# Patient Record
Sex: Female | Born: 1955 | Hispanic: No | Marital: Married | Smoking: Never smoker
Health system: Southern US, Community
[De-identification: ages and names within clinical notes are randomized; demographics above are authoritative.]

## PROBLEM LIST (undated history)

## (undated) DIAGNOSIS — I10 Essential (primary) hypertension: Secondary | ICD-10-CM

## (undated) DIAGNOSIS — F329 Major depressive disorder, single episode, unspecified: Secondary | ICD-10-CM

## (undated) DIAGNOSIS — Z87442 Personal history of urinary calculi: Secondary | ICD-10-CM

## (undated) DIAGNOSIS — K8010 Calculus of gallbladder with chronic cholecystitis without obstruction: Secondary | ICD-10-CM

## (undated) DIAGNOSIS — F32A Depression, unspecified: Secondary | ICD-10-CM

## (undated) DIAGNOSIS — M1990 Unspecified osteoarthritis, unspecified site: Secondary | ICD-10-CM

## (undated) DIAGNOSIS — J189 Pneumonia, unspecified organism: Secondary | ICD-10-CM

## (undated) DIAGNOSIS — R7303 Prediabetes: Secondary | ICD-10-CM

## (undated) DIAGNOSIS — I82409 Acute embolism and thrombosis of unspecified deep veins of unspecified lower extremity: Secondary | ICD-10-CM

## (undated) HISTORY — PX: OTHER SURGICAL HISTORY: SHX169

## (undated) HISTORY — PX: APPENDECTOMY: SHX54

## (undated) HISTORY — PX: TOTAL VAGINAL HYSTERECTOMY: SHX2548

## (undated) HISTORY — PX: ABDOMINAL HYSTERECTOMY: SHX81

---

## 2005-09-16 ENCOUNTER — Emergency Department: Payer: Self-pay | Admitting: Internal Medicine

## 2005-11-01 ENCOUNTER — Emergency Department: Payer: Self-pay | Admitting: Emergency Medicine

## 2007-01-02 IMAGING — CT CT HEAD WITHOUT CONTRAST
2 series · 16 of 30 positions shown, 20 images · non-contrast
Comparison: none

REASON FOR EXAM: h/a  tingling in arm   [HOSPITAL]
COMMENTS:

[Series 2: without · axial · non-contrast · 0.39mm/px · z∈[+116,+236]mm · 13 of 29 slices shown, 17 images]
[im 3/29  brain]
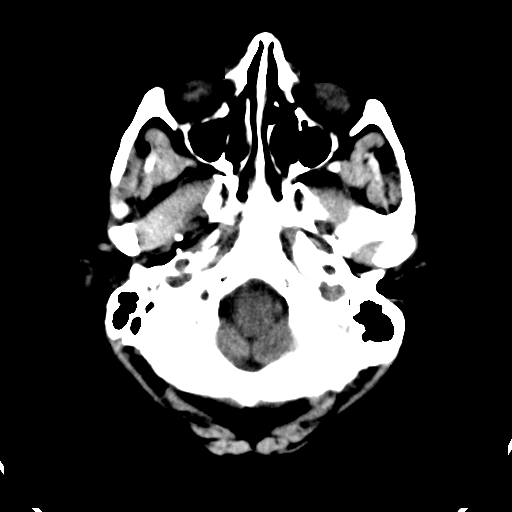
[im 3/29  bone]
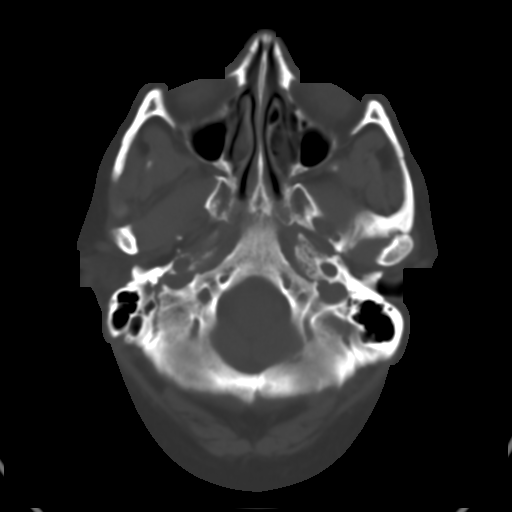
[im 5/29  brain]
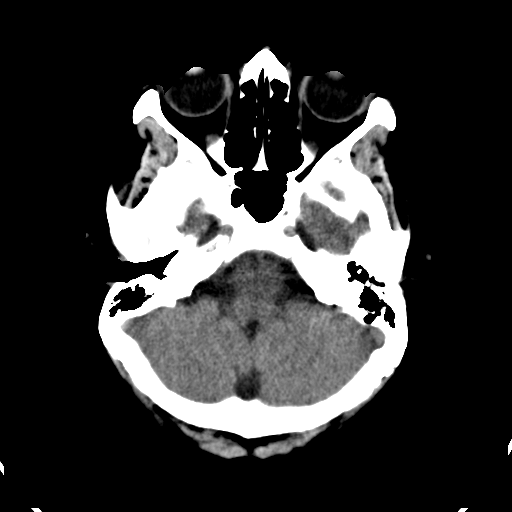
[im 7/29  brain]
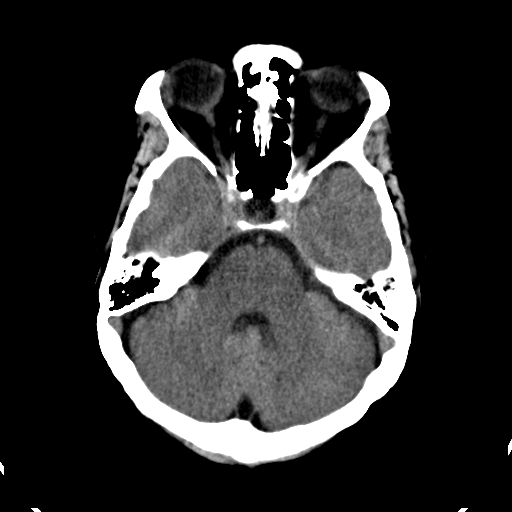
[im 9/29  brain]
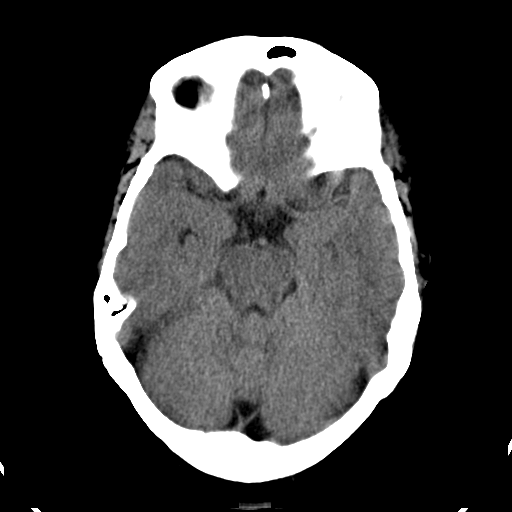
[im 11/29  brain]
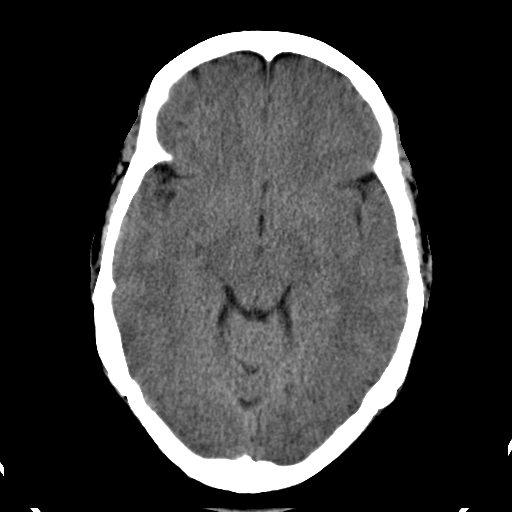
[im 11/29  bone]
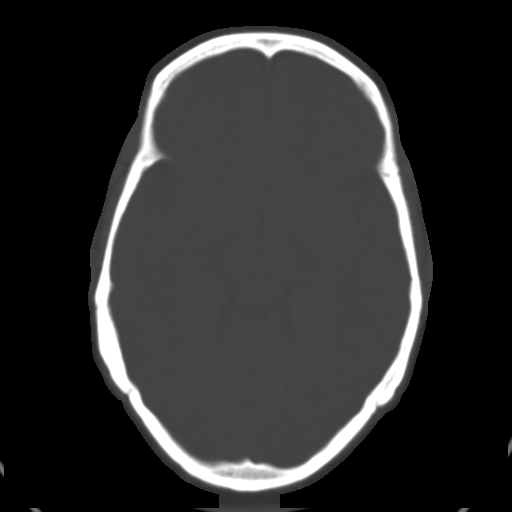
[im 13/29  brain]
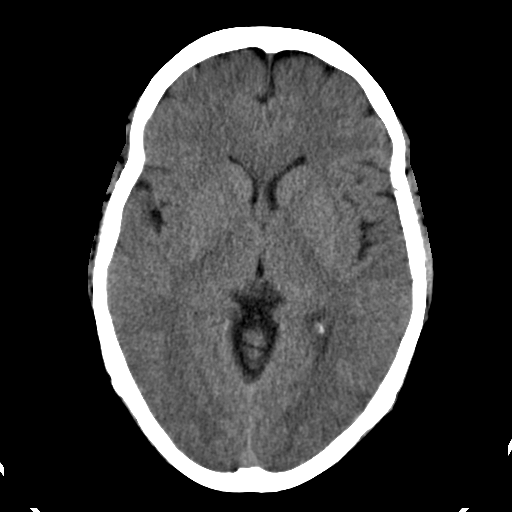
[im 15/29  brain]
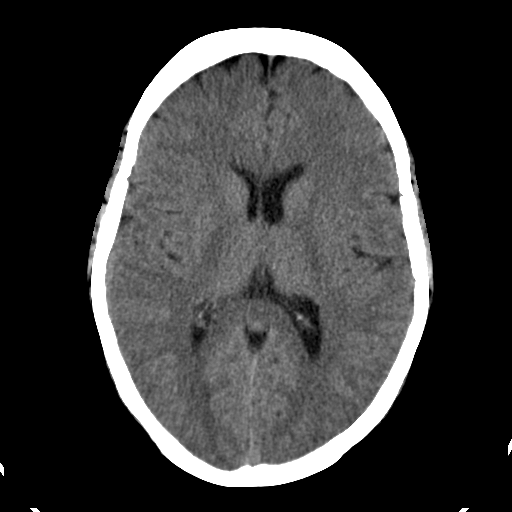
[im 17/29  brain]
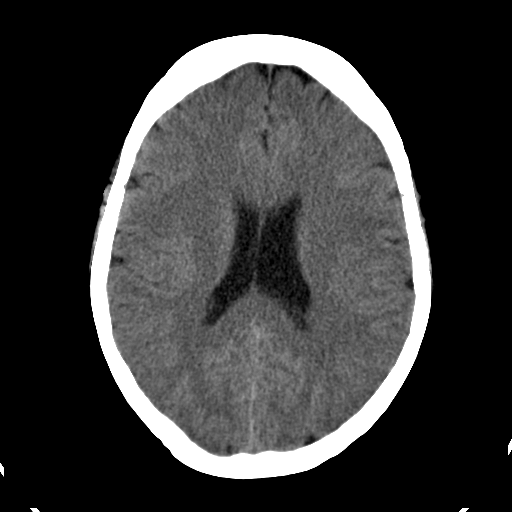
[im 19/29  brain]
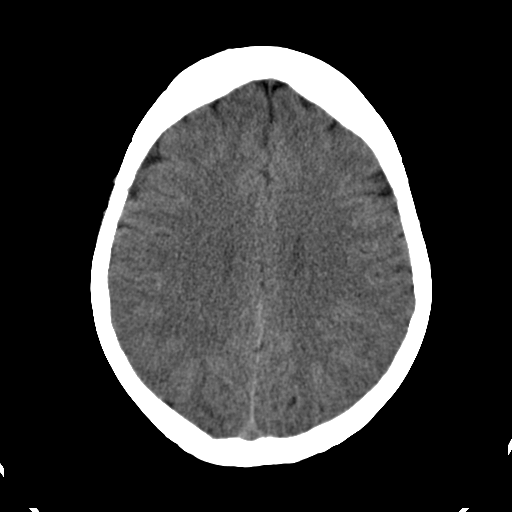
[im 19/29  bone]
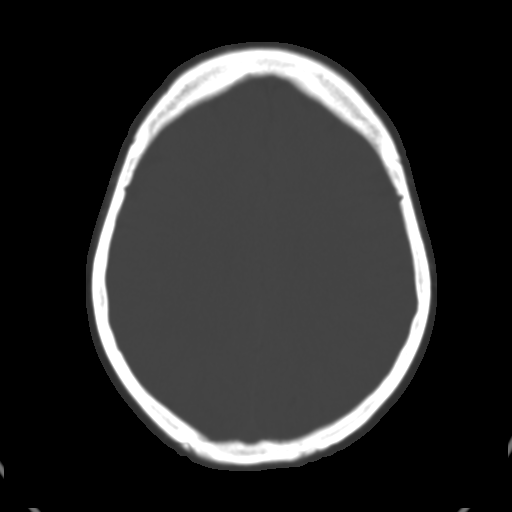
[im 21/29  brain]
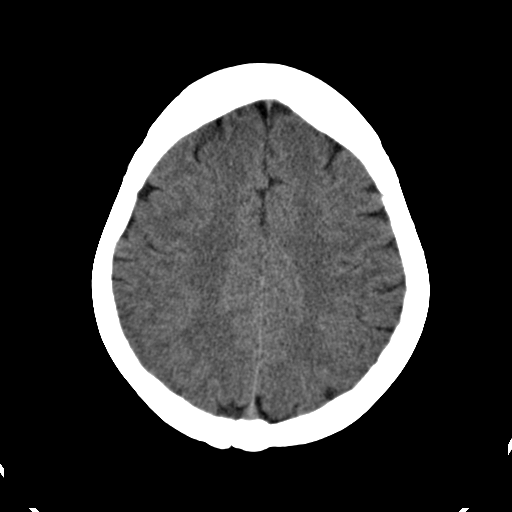
[im 23/29  brain]
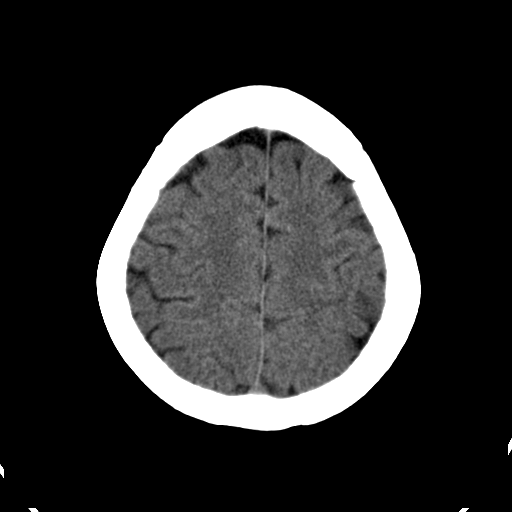
[im 25/29  brain]
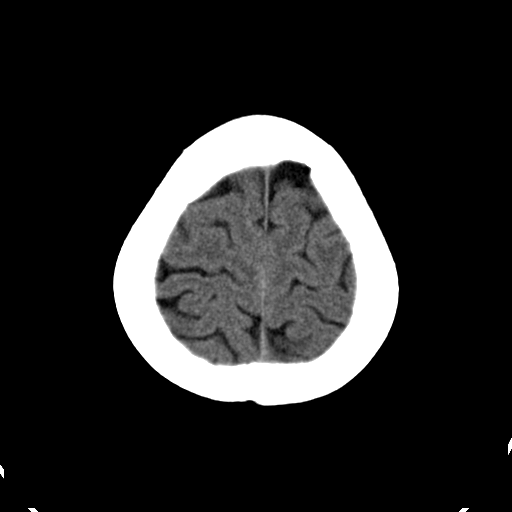
[im 27/29  brain]
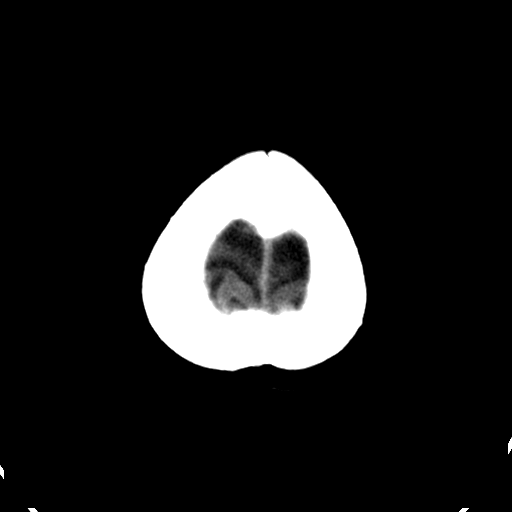
[im 27/29  bone]
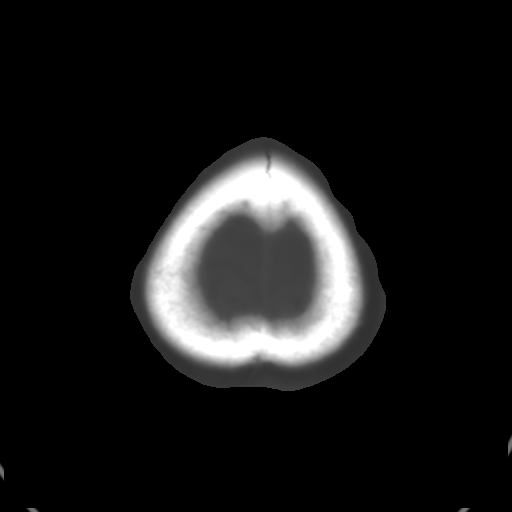

[Series 3: bone · axial · 0.39mm/px · z∈[+116,+156]mm · 3 of 29 slices shown]
[im 3/29  bone]
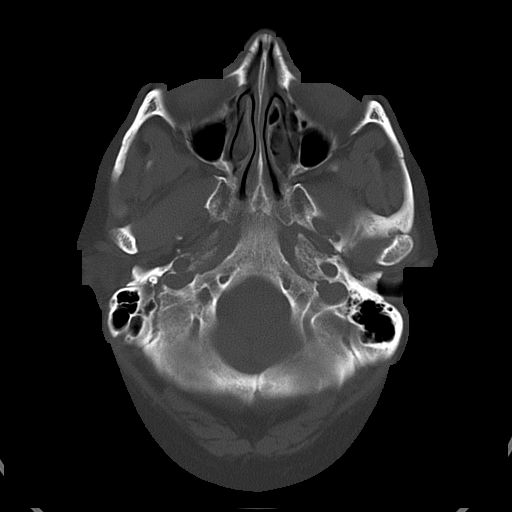
[im 7/29  bone]
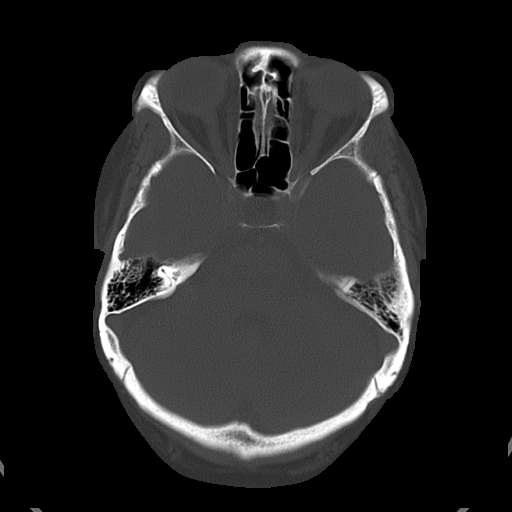
[im 11/29  bone]
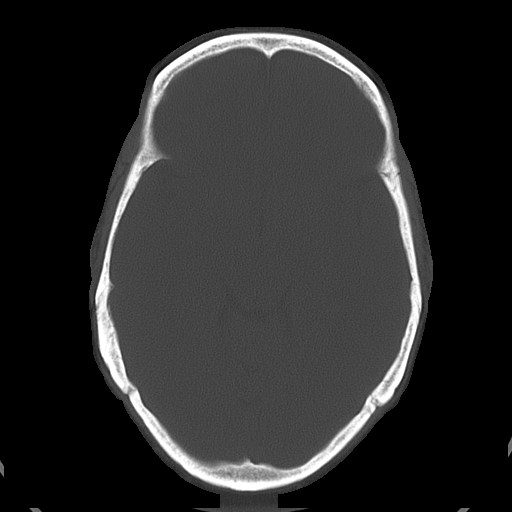

[16 of 30 positions shown; findings below may reference images not displayed]

PROCEDURE:     CT  - CT HEAD WITHOUT CONTRAST  - September 16, 2005  [DATE]

RESULT:     Unenhanced emergent CT scan was obtained for headache and RIGHT
arm weakness.

No intracerebral bleeds or infarcts are noted. No mass effect. No shift to
the midline. The ventricles appear within normal limits. No extra-axial
fluid collections are identified.

On the bone window settings there is incidentally noted a retention cyst in
the LEFT maxillary sinus, otherwise, the sinuses appear clear. The exam was
read by the [HOSPITAL].
IMPRESSION: 1)No significant abnormalities are identified on the unenhanced emergent
Head CT.

## 2009-03-15 ENCOUNTER — Inpatient Hospital Stay: Payer: Self-pay | Admitting: Psychiatry

## 2009-03-16 ENCOUNTER — Ambulatory Visit: Payer: Self-pay | Admitting: Cardiology

## 2016-09-19 ENCOUNTER — Encounter: Payer: Self-pay | Admitting: Medical Oncology

## 2016-09-19 ENCOUNTER — Emergency Department
Admission: EM | Admit: 2016-09-19 | Discharge: 2016-09-19 | Disposition: A | Discharge status: Home / Self Care | Payer: Medicaid Other | Attending: Emergency Medicine | Admitting: Emergency Medicine

## 2016-09-19 DIAGNOSIS — F329 Major depressive disorder, single episode, unspecified: Secondary | ICD-10-CM | POA: Insufficient documentation

## 2016-09-19 DIAGNOSIS — M5442 Lumbago with sciatica, left side: Secondary | ICD-10-CM | POA: Diagnosis not present

## 2016-09-19 DIAGNOSIS — Z791 Long term (current) use of non-steroidal anti-inflammatories (NSAID): Secondary | ICD-10-CM | POA: Diagnosis not present

## 2016-09-19 DIAGNOSIS — I10 Essential (primary) hypertension: Secondary | ICD-10-CM

## 2016-09-19 DIAGNOSIS — M545 Low back pain: Secondary | ICD-10-CM | POA: Diagnosis present

## 2016-09-19 DIAGNOSIS — M5432 Sciatica, left side: Secondary | ICD-10-CM

## 2016-09-19 HISTORY — DX: Depression, unspecified: F32.A

## 2016-09-19 HISTORY — DX: Unspecified osteoarthritis, unspecified site: M19.90

## 2016-09-19 HISTORY — DX: Essential (primary) hypertension: I10

## 2016-09-19 HISTORY — DX: Major depressive disorder, single episode, unspecified: F32.9

## 2016-09-19 MED ORDER — CYCLOBENZAPRINE HCL 5 MG PO TABS: 5.0000 mg | ORAL_TABLET | Freq: Three times a day (TID) | ORAL | 0 refills | Status: DC | PRN

## 2016-09-19 MED ORDER — HYDROCHLOROTHIAZIDE 12.5 MG PO TABS: 12.5000 mg | ORAL_TABLET | Freq: Every day | ORAL | 0 refills | Status: DC

## 2016-09-19 MED ORDER — PREDNISONE 5 MG PO TABS: 30.0000 mg | ORAL_TABLET | Freq: Every day | ORAL | 0 refills | Status: DC

## 2016-09-19 NOTE — ED Notes (Signed)
See triage note  Having pain from lower back which radiates into both legs   But pain is more severe in left leg  Denies any injury

## 2016-09-19 NOTE — ED Provider Notes (Signed)
[facility] Emergency Department Provider Note  ____________________________________________  Time seen: Approximately 10:07 AM  I have reviewed the triage vital signs and the nursing notes.   HISTORY  Chief Complaint Back Pain and Leg Pain  History, physical exam and discharge completed in the presence of the medical interpreter.  HPI Holly Carter is a 60 y.o. female , NAD, presents to the emergency department accompanied by her daughter who assists with history. Patient states she has had left lower back pain that radiates down into the left leg for the past 2 weeks. States it is worsened over the last couple of days. Denies any injury, traumas or falls to exacerbate the pain. States she has chronic arthritis and believes it is related. Notes that she has arthritis in multiple joints of her extremities as well as her back. Denies any saddle paresthesias or loss of bowel or bladder control. Also notes that she has been without her antidepressants as well as antihypertensive medication for well over a month. She unfortunately lost her insurance and has not been able to gain refills on these medications. Her daughter notes that she has an appointment for the patient to establish care with a local primary care physician on November 22. Patient denies any chest pain, shortness of breath, abdominal pain, nausea or vomiting. Has had no fevers, chills, body aches. Denies SI or HI.   Past Medical History:  Diagnosis Date  . Arthritis   . Depression   . Hypertension     There are no active problems to display for this patient.   History reviewed. No pertinent surgical history.  Prior to Admission medications   Medication Sig Start Date End Date Taking? Authorizing Provider  naproxen (NAPROSYN) 500 MG tablet Take 500 mg by mouth 2 (two) times daily with a meal.   Yes Historical Provider, MD  cyclobenzaprine (FLEXERIL) 5 MG tablet Take 1 tablet (5 mg total) by  mouth 3 (three) times daily as needed for muscle spasms. 09/19/16   Neri Samek L Rome Echavarria, PA-C  hydrochlorothiazide (HYDRODIURIL) 12.5 MG tablet Take 1 tablet (12.5 mg total) by mouth daily. 09/19/16 10/19/16  [place] L Johanne Mcglade, PA-C  predniSONE (DELTASONE) 5 MG tablet Take 6 tablets (30 mg total) by mouth daily with breakfast. May take for up to 5 days.  Do not take any NSAIDs with this medication. Take with food. 09/19/16   Tinzlee Craker L Aerie Donica, PA-C    Allergies Patient has no known allergies.  No family history on file.  Social History Social History  Substance Use Topics  . Smoking status: Never Smoker  . Smokeless tobacco: Never Used  . Alcohol use No     Review of Systems  Constitutional: No fever/chills Eyes: No visual changes.  Cardiovascular: No chest pain. Respiratory: No shortness of breath. No wheezing.  Gastrointestinal: No abdominal pain.  No nausea, vomiting.  Musculoskeletal: Positive for back pain radiating into left leg.  Skin: Negative for rash. Neurological: Negative for headaches, focal weakness or numbness.   No tingling. No saddle paresthesias or loss of bowel or bladder control Psychological:  Positive chronic depression. Denies SI or HI. 10-point ROS otherwise negative.  ____________________________________________   PHYSICAL EXAM:  VITAL SIGNS: ED Triage Vitals  Enc Vitals Group     BP 09/19/16 0936 (!) 152/84     Pulse Rate 09/19/16 0936 73     Resp 09/19/16 0936 18     Temp 09/19/16 0936 98.1 F (36.7 C)     Temp  Source 09/19/16 0936 Oral     SpO2 09/19/16 0936 98 %     Weight 09/19/16 0937 200 lb (90.7 kg)     Height 09/19/16 0937 5\' 4"  (1.626 m)     Head Circumference --      Peak Flow --      Pain Score 09/19/16 0938 10     Pain Loc --      Pain Edu? --      Excl. in GC? --      Constitutional: Alert and oriented. Well appearing and in no acute distress. Eyes: Conjunctivae are normal Without icterus or injection Head:  Atraumatic. Cardiovascular: Normal rate, regular rhythm. Normal S1 and S2.  Good peripheral circulation. Respiratory: Normal respiratory effort without tachypnea or retractions. Lungs CTAB with breath sounds noted in all lung fields. No wheeze, rhonchi, rales. Musculoskeletal: No tenderness to palpation about the lumbar spine or paraspinal regions. Mild left SI joint tenderness with deep palpation. Negative straight leg raise bilaterally. Full range of motion of bilateral upper and lower extremities without difficulty. No lower extremity tenderness nor edema.  No joint effusions. Neurologic:  Normal speech and language. No gross focal neurologic deficits are appreciated.  Skin:  Skin is warm, dry and intact. No rash noted. Patient does have black ecchymosis about bilateral upper buttocks in which she states have been present for 5 years from previous injections that she received in [place] containing vitamins. No tenderness to palpation. No cellulitis or induration. Psychiatric: Mood and affect are normal. Speech and behavior are normal. Patient exhibits appropriate insight and judgement.   ____________________________________________   LABS  None ____________________________________________  EKG  None ____________________________________________  RADIOLOGY  None ____________________________________________    PROCEDURES  Procedure(s) performed: None   Procedures   Medications - No data to display   ____________________________________________   INITIAL IMPRESSION / ASSESSMENT AND PLAN / ED COURSE  Pertinent labs & imaging results that were available during my care of the patient were reviewed by me and considered in my medical decision making (see chart for details).  Clinical Course     Patient's diagnosis is consistent with Sciatica of the left side and unspecified hypertension. Patient will be discharged home with prescriptions for prednisone, Flexeril and  hydrochlorothiazide to take as directed. Patient is to follow up with her primary care provider as scheduled on November 22 to establish care and for recheck. Discussed with the patient that unfortunately we would not be able to refill her antidepressants that she had been off the medications were too long and discuss potential side effects that would need to be managed and followed by her new primary care provider. Patient and her daughter at the bedside and understands such. Patient is given ED precautions to return to the ED for any worsening or new symptoms.    ____________________________________________  FINAL CLINICAL IMPRESSION(S) / ED DIAGNOSES  Final diagnoses:  Sciatica of left side  Hypertension, unspecified type      NEW MEDICATIONS STARTED DURING THIS VISIT:  Discharge Medication List as of 09/19/2016 10:10 AM    START taking these medications   Details  cyclobenzaprine (FLEXERIL) 5 MG tablet Take 1 tablet (5 mg total) by mouth 3 (three) times daily as needed for muscle spasms., Starting Fri 09/19/2016, Print    hydrochlorothiazide (HYDRODIURIL) 12.5 MG tablet Take 1 tablet (12.5 mg total) by mouth daily., Starting Fri 09/19/2016, Until Sun 10/19/2016, Print    predniSONE (DELTASONE) 5 MG tablet Take 6 tablets (30 mg total)  by mouth daily with breakfast. May take for up to 5 days.  Do not take any NSAIDs with this medication. Take with food., Starting Fri 09/19/2016, Print             Ernestene Kiel Lawler, PA-C 09/19/16 1307    Emily Filbert, MD 09/19/16 1348

## 2016-09-19 NOTE — ED Triage Notes (Signed)
Using interpreter pt reports that she has been having lower back pain with radiation of pain into left leg for several weeks, pain worsened yesterday.

## 2017-01-06 ENCOUNTER — Encounter: Payer: Self-pay | Admitting: *Deleted

## 2017-01-06 ENCOUNTER — Emergency Department
Admission: EM | Admit: 2017-01-06 | Discharge: 2017-01-06 | Disposition: A | Discharge status: Home / Self Care | Payer: Medicaid Other | Attending: Emergency Medicine | Admitting: Emergency Medicine

## 2017-01-06 DIAGNOSIS — Z79899 Other long term (current) drug therapy: Secondary | ICD-10-CM | POA: Insufficient documentation

## 2017-01-06 DIAGNOSIS — H7291 Unspecified perforation of tympanic membrane, right ear: Secondary | ICD-10-CM | POA: Diagnosis not present

## 2017-01-06 DIAGNOSIS — I10 Essential (primary) hypertension: Secondary | ICD-10-CM | POA: Insufficient documentation

## 2017-01-06 DIAGNOSIS — H9201 Otalgia, right ear: Secondary | ICD-10-CM | POA: Diagnosis present

## 2017-01-06 MED ORDER — OFLOXACIN 0.3 % OT SOLN: 5.0000 [drp] | Freq: Every day | OTIC | 0 refills | Status: AC

## 2017-01-06 NOTE — ED Notes (Signed)
See triage note  Per interpreter she has had bilateral ear pain/pressure for couple of days  Afebrile on arrival

## 2017-01-06 NOTE — ED Triage Notes (Signed)
Pt complains of bilateral ear pain starting Sunday, pt reports increased pain today with pressure

## 2017-01-06 NOTE — ED Provider Notes (Signed)
[facility] Provider Note  ____________________________________________  Time seen: Approximately 6:33 PM  I have reviewed the triage vital signs and the nursing notes.   HISTORY  Chief Complaint Otalgia    HPI Holly Carter is a 61 y.o. female who presents emergency department complaining of sharp right ear pain and mild left ear pain. Per the patient, symptoms have been ongoing 2 days and worsening. Patient reports that she does use Q-tips to clean out her ears on a regular basis. She states over the last 2 days she has noted some bloody drainage from her right ear. Patient denies any fevers or chills, nasal congestion, sore throat, coughing. No other URI symptoms. No other complaints at this time. No medications prior to arrival for this complaint.   Past Medical History:  Diagnosis Date  . Arthritis   . Depression   . Hypertension     There are no active problems to display for this patient.   History reviewed. No pertinent surgical history.  Prior to Admission medications   Medication Sig Start Date End Date Taking? Authorizing Provider  cyclobenzaprine (FLEXERIL) 5 MG tablet Take 1 tablet (5 mg total) by mouth 3 (three) times daily as needed for muscle spasms. 09/19/16   Jami L Hagler, PA-C  hydrochlorothiazide (HYDRODIURIL) 12.5 MG tablet Take 1 tablet (12.5 mg total) by mouth daily. 09/19/16 10/19/16  Jami L Hagler, PA-C  naproxen (NAPROSYN) 500 MG tablet Take 500 mg by mouth 2 (two) times daily with a meal.    Historical Provider, MD  ofloxacin (FLOXIN) 0.3 % otic solution Place 5 drops into the right ear daily. 01/06/17 01/13/17  Christiane HaJonathan D Medford Staheli, PA-C  predniSONE (DELTASONE) 5 MG tablet Take 6 tablets (30 mg total) by mouth daily with breakfast. May take for up to 5 days.  Do not take any NSAIDs with this medication. Take with food. 09/19/16   Jami L Hagler, PA-C    Allergies Patient has no known  allergies.  No family history on file.  Social History Social History  Substance Use Topics  . Smoking status: Never Smoker  . Smokeless tobacco: Never Used  . Alcohol use No     Review of Systems  Constitutional: No fever/chills Eyes: No visual changes. No discharge ENT: Positive for bilateral ear pain, worse on right. Bloody drainage noted from right ear. Cardiovascular: no chest pain. Respiratory: no cough. No SOB. Gastrointestinal: No abdominal pain.  No nausea, no vomiting.  Musculoskeletal: Negative for musculoskeletal pain. Skin: Negative for rash, abrasions, lacerations, ecchymosis. Neurological: Negative for headaches, focal weakness or numbness. 10-point ROS otherwise negative.  ____________________________________________   PHYSICAL EXAM:  VITAL SIGNS: ED Triage Vitals  Enc Vitals Group     BP 01/06/17 1752 136/65     Pulse Rate 01/06/17 1752 78     Resp 01/06/17 1752 20     Temp 01/06/17 1752 98.6 F (37 C)     Temp Source 01/06/17 1752 Oral     SpO2 01/06/17 1752 96 %     Weight 01/06/17 1753 180 lb (81.6 kg)     Height 01/06/17 1753 4\' 9"  (1.448 m)     Head Circumference --      Peak Flow --      Pain Score 01/06/17 1753 9     Pain Loc --      Pain Edu? --      Excl. in GC? --      Constitutional: Alert and oriented.  Well appearing and in no acute distress. Eyes: Conjunctivae are normal. PERRL. EOMI. Head: Atraumatic. ENT:      Ears: TM and EAC on left unremarkable. EAC on right has bloody drainage noted. Coagulation and blood noted against TM. There does appear to be a perforation to the tympanic membrane. Membrane is not completely visualized due to congealed blood in the ear canal.      Nose: No congestion/rhinnorhea.      Mouth/Throat: Mucous membranes are moist.  Neck: No stridor.   Hematological/Lymphatic/Immunilogical: No cervical lymphadenopathy. Cardiovascular: Normal rate, regular rhythm. Normal S1 and S2.  Good peripheral  circulation. Respiratory: Normal respiratory effort without tachypnea or retractions. Lungs CTAB. Good air entry to the bases with no decreased or absent breath sounds. Musculoskeletal: Full range of motion to all extremities. No gross deformities appreciated. Neurologic:  Normal speech and language. No gross focal neurologic deficits are appreciated.  Skin:  Skin is warm, dry and intact. No rash noted. Psychiatric: Mood and affect are normal. Speech and behavior are normal. Patient exhibits appropriate insight and judgement.   ____________________________________________   LABS (all labs ordered are listed, but only abnormal results are displayed)  Labs Reviewed - No data to display ____________________________________________  EKG   ____________________________________________  RADIOLOGY   No results found.  ____________________________________________    PROCEDURES  Procedure(s) performed:    Procedures    Medications - No data to display   ____________________________________________   INITIAL IMPRESSION / ASSESSMENT AND PLAN / ED COURSE  Pertinent labs & imaging results that were available during my care of the patient were reviewed by me and considered in my medical decision making (see chart for details).  Review of the [place] CSRS was performed in accordance of the [facility] prior to dispensing any controlled drugs.     Patient's diagnosis is consistent with tympanic membrane perforation on right. This is likely secondary to use of Q-tips in the ear. There is blood in the EAC and against TM. This caused him to be not completely or well visualized. However there doesn't appear to be a perforation.. Patient will be discharged home with prescriptions for ofloxacin eardrops as this was likely an external puncture.. Patient is to follow up with ENT provider. Patient is given ED precautions to return to the ED for any worsening or new  symptoms.     ____________________________________________  FINAL CLINICAL IMPRESSION(S) / ED DIAGNOSES  Final diagnoses:  Tympanic membrane perforation, right      NEW MEDICATIONS STARTED DURING THIS VISIT:  New Prescriptions   OFLOXACIN (FLOXIN) 0.3 % OTIC SOLUTION    Place 5 drops into the right ear daily.        This chart was dictated using voice recognition software/Dragon. Despite best efforts to proofread, errors can occur which can change the meaning. Any change was purely unintentional.    Racheal Patches, PA-C 01/06/17 1840    Sharman Cheek, MD 01/09/17 [phone]

## 2017-01-06 NOTE — ED Triage Notes (Signed)
interpreter used for triage 

## 2017-02-26 ENCOUNTER — Other Ambulatory Visit: Payer: Self-pay | Admitting: Otolaryngology

## 2017-02-26 DIAGNOSIS — K1120 Sialoadenitis, unspecified: Secondary | ICD-10-CM

## 2017-03-09 ENCOUNTER — Ambulatory Visit
Admission: RE | Admit: 2017-03-09 | Discharge: 2017-03-09 | Disposition: A | Discharge status: Home / Self Care | Payer: Medicaid Other | Source: Ambulatory Visit | Attending: Otolaryngology | Admitting: Otolaryngology

## 2017-03-09 DIAGNOSIS — K1120 Sialoadenitis, unspecified: Secondary | ICD-10-CM | POA: Diagnosis not present

## 2017-03-09 LAB — POCT I-STAT CREATININE: Creatinine, Ser: 0.5 mg/dL (ref 0.44–1.00)

## 2017-03-09 MED ORDER — IOPAMIDOL (ISOVUE-300) INJECTION 61%
75.0000 mL | Freq: Once | INTRAVENOUS | Status: AC | PRN
  Administered 2017-03-09: 75 mL via INTRAVENOUS

## 2017-07-17 ENCOUNTER — Encounter: Payer: Self-pay | Admitting: Emergency Medicine

## 2017-07-17 ENCOUNTER — Emergency Department: Payer: Medicaid Other

## 2017-07-17 DIAGNOSIS — Z79899 Other long term (current) drug therapy: Secondary | ICD-10-CM | POA: Diagnosis not present

## 2017-07-17 DIAGNOSIS — R51 Headache: Secondary | ICD-10-CM | POA: Insufficient documentation

## 2017-07-17 DIAGNOSIS — R55 Syncope and collapse: Secondary | ICD-10-CM | POA: Diagnosis present

## 2017-07-17 DIAGNOSIS — I10 Essential (primary) hypertension: Secondary | ICD-10-CM | POA: Diagnosis not present

## 2017-07-17 DIAGNOSIS — N3000 Acute cystitis without hematuria: Secondary | ICD-10-CM | POA: Insufficient documentation

## 2017-07-17 LAB — BASIC METABOLIC PANEL
ANION GAP: 8 (ref 5–15)
BUN: 15 mg/dL (ref 6–20)
CO2: 24 mmol/L (ref 22–32)
Calcium: 9.1 mg/dL (ref 8.9–10.3)
Chloride: 108 mmol/L (ref 101–111)
Creatinine, Ser: 0.8 mg/dL (ref 0.44–1.00)
GFR calc Af Amer: >60 mL/min (ref >60)
Glucose, Bld: 150 mg/dL — ABNORMAL HIGH (ref 65–99)
POTASSIUM: 3.9 mmol/L (ref 3.5–5.1)
SODIUM: 140 mmol/L (ref 135–145)

## 2017-07-17 LAB — CBC
HCT: 39.4 % (ref 35.0–47.0)
HEMOGLOBIN: 13.6 g/dL (ref 12.0–16.0)
MCH: 32.9 pg (ref 26.0–34.0)
MCHC: 34.6 g/dL (ref 32.0–36.0)
MCV: 95.3 fL (ref 80.0–100.0)
Platelets: 129 10*3/uL — ABNORMAL LOW (ref 150–440)
RBC: 4.14 MIL/uL (ref 3.80–5.20)
RDW: 15.2 % — ABNORMAL HIGH (ref 11.5–14.5)
WBC: 3 10*3/uL — AB (ref 3.6–11.0)

## 2017-07-17 LAB — TROPONIN I: Troponin I: <0.03 ng/mL (ref <0.03)

## 2017-07-17 NOTE — ED Triage Notes (Signed)
Pt presents via POV c/o syncopal episodes x2 at home. With with multiple complaints including headache, throat pain, and left sided chest pain x3 days.

## 2017-07-18 ENCOUNTER — Emergency Department
Admission: EM | Admit: 2017-07-18 | Discharge: 2017-07-18 | Disposition: A | Discharge status: Home / Self Care | Payer: Medicaid Other | Attending: Emergency Medicine | Admitting: Emergency Medicine

## 2017-07-18 ENCOUNTER — Emergency Department: Payer: Medicaid Other

## 2017-07-18 DIAGNOSIS — R51 Headache: Secondary | ICD-10-CM

## 2017-07-18 DIAGNOSIS — N390 Urinary tract infection, site not specified: Secondary | ICD-10-CM

## 2017-07-18 DIAGNOSIS — R519 Headache, unspecified: Secondary | ICD-10-CM

## 2017-07-18 DIAGNOSIS — R55 Syncope and collapse: Secondary | ICD-10-CM

## 2017-07-18 LAB — LIPASE, BLOOD: Lipase: 31 U/L (ref 11–51)

## 2017-07-18 LAB — URINALYSIS, COMPLETE (UACMP) WITH MICROSCOPIC
BILIRUBIN URINE: NEGATIVE
GLUCOSE, UA: NEGATIVE mg/dL
Hgb urine dipstick: NEGATIVE
KETONES UR: NEGATIVE mg/dL
Nitrite: POSITIVE — AB
PH: 5 (ref 5.0–8.0)
Protein, ur: NEGATIVE mg/dL
Specific Gravity, Urine: 1.021 (ref 1.005–1.030)

## 2017-07-18 LAB — HEPATIC FUNCTION PANEL
ALBUMIN: 3.9 g/dL (ref 3.5–5.0)
ALK PHOS: 109 U/L (ref 38–126)
ALT: 53 U/L (ref 14–54)
AST: 55 U/L — ABNORMAL HIGH (ref 15–41)
Bilirubin, Direct: <0.1 mg/dL — ABNORMAL LOW (ref 0.1–0.5)
TOTAL PROTEIN: 6.6 g/dL (ref 6.5–8.1)
Total Bilirubin: 0.5 mg/dL (ref 0.3–1.2)

## 2017-07-18 MED ORDER — KETOROLAC TROMETHAMINE 30 MG/ML IJ SOLN
30.0000 mg | Freq: Once | INTRAMUSCULAR | Status: AC
  Administered 2017-07-18: 30 mg via INTRAVENOUS
  Filled 2017-07-18: qty 1

## 2017-07-18 MED ORDER — METOCLOPRAMIDE HCL 5 MG/ML IJ SOLN
10.0000 mg | Freq: Once | INTRAMUSCULAR | Status: AC
  Administered 2017-07-18: 10 mg via INTRAVENOUS
  Filled 2017-07-18: qty 2

## 2017-07-18 MED ORDER — CEPHALEXIN 500 MG PO CAPS
500.0000 mg | ORAL_CAPSULE | Freq: Two times a day (BID) | ORAL | 0 refills | Status: AC
Start: 2017-07-18 — End: 2017-07-28

## 2017-07-18 MED ORDER — IOPAMIDOL (ISOVUE-300) INJECTION 61%
30.0000 mL | Freq: Once | INTRAVENOUS | Status: AC | PRN
  Administered 2017-07-18: 30 mL via ORAL

## 2017-07-18 MED ORDER — IOPAMIDOL (ISOVUE-300) INJECTION 61%
100.0000 mL | Freq: Once | INTRAVENOUS | Status: AC | PRN
  Administered 2017-07-18: 100 mL via INTRAVENOUS

## 2017-07-18 MED ORDER — SODIUM CHLORIDE 0.9 % IV BOLUS (SEPSIS)
1000.0000 mL | Freq: Once | INTRAVENOUS | Status: AC
  Administered 2017-07-18: 1000 mL via INTRAVENOUS

## 2017-07-18 NOTE — ED Notes (Signed)
Pt assisted up to urinate ° °

## 2017-07-18 NOTE — ED Notes (Signed)
Dr. Marisa Severin in to reassess.

## 2017-07-18 NOTE — ED Notes (Signed)
Information for assessment obtained with Rozetta Nunnery, spanish intrepreter with [employer]. Pt states she has had a sore throat for 3 months, right sided headache for three months. Pt states tonight had an episode of becoming dizzy and fainted. Family states she was not "coming around like she should" prompting them to come to ED. Pt denies shob. Pt states she has had some left sided chet pain for over 3 months as well.

## 2017-07-18 NOTE — ED Notes (Signed)
Pt and family updated on progression of ct results.

## 2017-07-18 NOTE — ED Notes (Signed)
Report to kristin, rn.  

## 2017-07-18 NOTE — ED Provider Notes (Signed)
[facility] Emergency Department Provider Note ____________________________________________   First MD Initiated Contact with Patient 07/18/17 0150     (approximate)  I have reviewed the triage vital signs and the nursing notes.   HISTORY  Chief Complaint Loss of Consciousness and Headache    HPI Holly Carter is a 61 y.o. female With history of hypertension, depression, and arthritis as well as migraines presents with syncope twice today approximately 4 hours ago, occurring while the patient was sitting on the couch and associated with a prodrome of feeling lightheaded and weak. Patient states that she has had a headache for the last 3 days which is gradual in onset and identical to prior migraines which she has had multiple times in the past. Patient also reports some left-sided chest pain primarily around the left breast, and chronic throat discomfort x months.  She also reports some right sided lower abd pain.  No head trauma.    Past Medical History:  Diagnosis Date  . Arthritis   . Depression   . Hypertension     There are no active problems to display for this patient.   No past surgical history on file.  Prior to Admission medications   Medication Sig Start Date End Date Taking? Authorizing Provider  cephALEXin (KEFLEX) 500 MG capsule Take 1 capsule (500 mg total) by mouth 2 (two) times daily. 07/18/17 07/28/17  Dionne Bucy, MD  cyclobenzaprine (FLEXERIL) 5 MG tablet Take 1 tablet (5 mg total) by mouth 3 (three) times daily as needed for muscle spasms. 09/19/16   Hagler, Jami L, PA-C  hydrochlorothiazide (HYDRODIURIL) 12.5 MG tablet Take 1 tablet (12.5 mg total) by mouth daily. 09/19/16 10/19/16  Hagler, Jami L, PA-C  naproxen (NAPROSYN) 500 MG tablet Take 500 mg by mouth 2 (two) times daily with a meal.    [provider]  predniSONE (DELTASONE) 5 MG tablet Take 6 tablets (30 mg total) by mouth daily with breakfast. May  take for up to 5 days.  Do not take any NSAIDs with this medication. Take with food. 09/19/16   Hagler, Jami L, PA-C    Allergies Patient has no known allergies.  No family history on file.  Social History Social History  Substance Use Topics  . Smoking status: Never Smoker  . Smokeless tobacco: Never Used  . Alcohol use No    Review of Systems  Constitutional: No fever/chills Eyes: No visual changes. ENT: Positive for sore throat. Cardiovascular: Positive for chest pain. Respiratory: Denies shortness of breath. Gastrointestinal: Positive for nausea, no vomiting.  No diarrhea.  Genitourinary: Negative for dysuria or frequency.  Musculoskeletal: Negative for back pain. Skin: Negative for rash. Neurological: Positive for headache.   ____________________________________________   PHYSICAL EXAM:  VITAL SIGNS: ED Triage Vitals  Enc Vitals Group     BP 07/17/17 2208 (!) 150/71     Pulse Rate 07/17/17 2208 84     Resp 07/17/17 2208 14     Temp 07/17/17 2208 99.1 F (37.3 C)     Temp Source 07/17/17 2208 Oral     SpO2 07/17/17 2208 93 %     Weight 07/17/17 2208 195 lb (88.5 kg)     Height 07/17/17 2208  (1.549 m)     Head Circumference --      Peak Flow --      Pain Score 07/17/17 2207 9     Pain Loc --      Pain Edu? --  Excl. in GC? --     Constitutional: Alert and oriented. Slightly uncomfortable appearing, in no acute distress. Eyes: Conjunctivae are normal.  EOMI.  PERRLA.  Head: Atraumatic. Nose: No congestion/rhinnorhea. Mouth/Throat: Mucous membranes are moist.  Oropharynx clear with no erythema or swelling.  Neck: Normal range of motion. Moving head freely. No meningeal signs.  Cardiovascular: Normal rate, regular rhythm. Grossly normal heart sounds.  Good peripheral circulation. Respiratory: Normal respiratory effort.  No retractions. Lungs CTAB. Gastrointestinal: Soft with mild RLQ tenderness. No distention.  Genitourinary: No CVA  tenderness. Musculoskeletal: No lower extremity edema.  Extremities warm and well perfused.  Neurologic:  Normal speech and language. No gross focal neurologic deficits are appreciated. CNs III-XII intact.  Normal coordination.  Motor and sensory intact in all extremities.   Skin:  Skin is warm and dry. No rash noted. Psychiatric: Mood and affect are normal. Speech and behavior are normal.  ____________________________________________   LABS (all labs ordered are listed, but only abnormal results are displayed)  Labs Reviewed  BASIC METABOLIC PANEL - Abnormal; Notable for the following:       Result Value   Glucose, Bld 150 (*)    All other components within normal limits  CBC - Abnormal; Notable for the following:    WBC 3.0 (*)    RDW 15.2 (*)    Platelets 129 (*)    All other components within normal limits  URINALYSIS, COMPLETE (UACMP) WITH MICROSCOPIC - Abnormal; Notable for the following:    Color, Urine YELLOW (*)    APPearance CLEAR (*)    Nitrite POSITIVE (*)    Leukocytes, UA TRACE (*)    Bacteria, UA FEW (*)    Squamous Epithelial / LPF 0-5 (*)    All other components within normal limits  HEPATIC FUNCTION PANEL - Abnormal; Notable for the following:    AST 55 (*)    Bilirubin, Direct <0.1 (*)    All other components within normal limits  TROPONIN I  LIPASE, BLOOD   ____________________________________________  EKG  ED ECG REPORT I, Dionne Bucy, the attending physician, personally viewed and interpreted this ECG.  Date: 07/18/2017 EKG Time: 2209 Rate: 78 Rhythm: normal sinus rhythm QRS Axis: normal Intervals: left posterior fascicular block ST/T Wave abnormalities: flat T-wave in V2 Narrative Interpretation: no evidence of acute ischemia; no significant changes when compared to EKG of 03/16/2009  ____________________________________________  RADIOLOGY  Chest x-ray shows aortic atherosclerosis, no acute pulmonary findings.    ____________________________________________   PROCEDURES  Procedure(s) performed: No    Critical Care performed: No ____________________________________________   INITIAL IMPRESSION / ASSESSMENT AND PLAN / ED COURSE  Pertinent labs & imaging results that were available during my care of the patient were reviewed by me and considered in my medical decision making (see chart for details).  61 year old female with past medical history as noted presents with syncope twice this evening with prodrome of lightheadedness, as well as multiple symptoms over the last few days including headache which is similar to prior migraines, left chest pain, right lower abdominal pain, and throat pain. Vital signs are normal, patient is comfortable appearing, and exam is remarkable for mild right lower quadrant tenderness but with no other significant findings.    A/P: 1. Syncope: suspect most likely vasovagal episode given the prodrome of lightheadedness and the occurence after patient had been having multiple other symptoms over the last few days - possible related dec PO intake and dehydration. EKG with no concerning findings.  Pending troponin and will give fluids and reassess.   2. Headache: nonfocal neuro exam, no fever or meningeal signs, and prior hx of similar headaches.  Susp most likely migraine.  No indication for imaging; plan for symptomatic tx with reglan, toradol and reassess.  3. Chest pain: low susp for ACS, given unremarkable EKG.  Pending troponin.  If negative, no indication for further workup.  Given multiple other concurrent sx, no evidence to suggest PE, or aortic etiology.  4. Abd pain: mild RLQ discomfort on exam.  Pt is s/p appendectomy.  Will obtain UA to r/o UTI/cysitis; if negative and pt has persistent pain and tenderness, will likely obtain CT abd.  Also consider hepatobiliary cause though location of pain makes this less  likely.  ----------------------------------------- 5:41 AM on 07/18/2017 -----------------------------------------  Patient's workup is significant for positive UA with nitrates and WBCs, consistent with UTI.  Other lab workup is unremarkable. On reassessment, patient reports that headache is almost completely resolved and she states she feels significantly better. She states that her abdominal pain is also improved but on exam she is still significantly tender in the right lower quadrant. This may be from UTI/cystitis however given the focal tenderness I will obtain CT to rule out intra-abdominal process. Anticipate that if CT is negative and patient continues to feel better, she will be able to be discharged home with abx for UTI.     ----------------------------------------- 7:19 AM on 07/18/2017 -----------------------------------------  CT with no acute findings. Patient states she feels better and appears comfortable. We will proceed with discharge, with antibiotics for UTI.  Return precautions given.  ____________________________________________   FINAL CLINICAL IMPRESSION(S) / ED DIAGNOSES  Final diagnoses:  Urinary tract infection without hematuria, site unspecified  Syncope, unspecified syncope type  Acute nonintractable headache, unspecified headache type      NEW MEDICATIONS STARTED DURING THIS VISIT:  New Prescriptions   CEPHALEXIN (KEFLEX) 500 MG CAPSULE    Take 1 capsule (500 mg total) by mouth 2 (two) times daily.     Note:  This document was prepared using Dragon voice recognition software and may include unintentional dictation errors.    Dionne Bucy, MD 07/18/17 [phone]

## 2017-07-18 NOTE — ED Notes (Signed)
Pt sleeping. 

## 2017-12-08 ENCOUNTER — Emergency Department
Admission: EM | Admit: 2017-12-08 | Discharge: 2017-12-08 | Disposition: A | Discharge status: Home / Self Care | Payer: Medicaid Other | Attending: Emergency Medicine | Admitting: Emergency Medicine

## 2017-12-08 ENCOUNTER — Other Ambulatory Visit: Payer: Self-pay

## 2017-12-08 DIAGNOSIS — F339 Major depressive disorder, recurrent, unspecified: Secondary | ICD-10-CM

## 2017-12-08 DIAGNOSIS — F338 Other recurrent depressive disorders: Secondary | ICD-10-CM | POA: Diagnosis not present

## 2017-12-08 DIAGNOSIS — F329 Major depressive disorder, single episode, unspecified: Secondary | ICD-10-CM | POA: Diagnosis present

## 2017-12-08 DIAGNOSIS — Z79899 Other long term (current) drug therapy: Secondary | ICD-10-CM | POA: Diagnosis not present

## 2017-12-08 DIAGNOSIS — I10 Essential (primary) hypertension: Secondary | ICD-10-CM | POA: Diagnosis not present

## 2017-12-08 DIAGNOSIS — R55 Syncope and collapse: Secondary | ICD-10-CM

## 2017-12-08 LAB — URINALYSIS, COMPLETE (UACMP) WITH MICROSCOPIC
BILIRUBIN URINE: NEGATIVE
Glucose, UA: NEGATIVE mg/dL
HGB URINE DIPSTICK: NEGATIVE
KETONES UR: NEGATIVE mg/dL
LEUKOCYTES UA: NEGATIVE
Nitrite: NEGATIVE
PROTEIN: NEGATIVE mg/dL
Specific Gravity, Urine: 1.012 (ref 1.005–1.030)
pH: 6 (ref 5.0–8.0)

## 2017-12-08 LAB — COMPREHENSIVE METABOLIC PANEL
ALBUMIN: 4.2 g/dL (ref 3.5–5.0)
ALK PHOS: 134 U/L — AB (ref 38–126)
ALT: 46 U/L (ref 14–54)
ANION GAP: 8 (ref 5–15)
AST: 51 U/L — ABNORMAL HIGH (ref 15–41)
BUN: 14 mg/dL (ref 6–20)
CHLORIDE: 104 mmol/L (ref 101–111)
CO2: 27 mmol/L (ref 22–32)
Calcium: 9.2 mg/dL (ref 8.9–10.3)
Creatinine, Ser: 0.57 mg/dL (ref 0.44–1.00)
GFR calc Af Amer: >60 mL/min (ref >60)
GFR calc non Af Amer: >60 mL/min (ref >60)
GLUCOSE: 120 mg/dL — AB (ref 65–99)
Potassium: 3.7 mmol/L (ref 3.5–5.1)
SODIUM: 139 mmol/L (ref 135–145)
Total Bilirubin: 0.6 mg/dL (ref 0.3–1.2)
Total Protein: 7.7 g/dL (ref 6.5–8.1)

## 2017-12-08 LAB — CBC
HCT: 40.1 % (ref 35.0–47.0)
HEMOGLOBIN: 13.2 g/dL (ref 12.0–16.0)
MCH: 31.4 pg (ref 26.0–34.0)
MCHC: 32.9 g/dL (ref 32.0–36.0)
MCV: 95.5 fL (ref 80.0–100.0)
PLATELETS: 170 10*3/uL (ref 150–440)
RBC: 4.2 MIL/uL (ref 3.80–5.20)
RDW: 13.9 % (ref 11.5–14.5)
WBC: 3.1 10*3/uL — ABNORMAL LOW (ref 3.6–11.0)

## 2017-12-08 LAB — ETHANOL: Alcohol, Ethyl (B): <10 mg/dL (ref <10)

## 2017-12-08 LAB — URINE DRUG SCREEN, QUALITATIVE (ARMC ONLY)
Amphetamines, Ur Screen: NOT DETECTED
BARBITURATES, UR SCREEN: NOT DETECTED
BENZODIAZEPINE, UR SCRN: NOT DETECTED
CANNABINOID 50 NG, UR ~~LOC~~: NOT DETECTED
Cocaine Metabolite,Ur ~~LOC~~: NOT DETECTED
MDMA (ECSTASY) UR SCREEN: NOT DETECTED
Methadone Scn, Ur: NOT DETECTED
Opiate, Ur Screen: NOT DETECTED
Phencyclidine (PCP) Ur S: NOT DETECTED
TRICYCLIC, UR SCREEN: POSITIVE — AB

## 2017-12-08 LAB — TROPONIN I

## 2017-12-08 NOTE — ED Notes (Addendum)
Pt belongings consists of the following: 1 grey jacket, 1 grey sweat pants, 1 underwear, 1 bra, 1 red shirt, 2 rings, a pair of grey sneakers, a pair of dangling earrings and 1 black hair tie. Pt's belongings given to daughter, Holly JaegerMaria De La Carter, per pt's request.

## 2017-12-08 NOTE — ED Triage Notes (Signed)
Pt in with co depression or years but got worse today, states had an issue with a sister today that made her anxious. Pt is on antidepressants at this time, pt has hx of suicidal thoughts but states not today.

## 2017-12-08 NOTE — ED Provider Notes (Signed)
[facility] Emergency Department Provider Note  ____________________________________________  Time seen: Approximately 10:01 PM  I have reviewed the triage vital signs and the nursing notes.   HISTORY  Chief Complaint Depression   HPI Holly Carter is a 62 y.o. female with a history of depression who presents for evaluation of worsening depression. According to patient's daughter for the last 2-3 days patient has been more depressed. Today one of patient's daughter had a problem with her husband which made patient very upset. She was crying and agitated. Patient denies SI. Daughter denies patient expressing any suicidal ideation. According to daughter patient has had prior episodes where she would hold a knife and tell the family she was going to kill herself but has never acted on these impulses. Has not done anything like this recently. She was given a phone number of a psychiatrist to establish care however has not made a phone call yet. Today she got so upset and agitated that she had a syncopal episode. Patient has had prior history of syncope when she gets very agitated or upset. No drug use. She endorses compliance with her antidepressants. She takes citalopram 20mg  daily. She has never seen a psychiatrist. No prior psychiatric admissions.  Chief Complaint: depression Severity: moderate Duration: 2-3 days Context: anti depressive medication not helping. Modifying factors: worse today due to family problems Associated signs/symptoms: No SI or HI   Past Medical History:  Diagnosis Date  . Arthritis   . Depression   . Hypertension     There are no active problems to display for this patient.   No past surgical history on file.  Prior to Admission medications   Medication Sig Start Date End Date Taking? Authorizing Provider  cyclobenzaprine (FLEXERIL) 5 MG tablet Take 1 tablet (5 mg total) by mouth 3 (three) times daily as needed for muscle  spasms. 09/19/16   Hagler, Jami L, PA-C  hydrochlorothiazide (HYDRODIURIL) 12.5 MG tablet Take 1 tablet (12.5 mg total) by mouth daily. 09/19/16 10/19/16  Hagler, Jami L, PA-C  naproxen (NAPROSYN) 500 MG tablet Take 500 mg by mouth 2 (two) times daily with a meal.    [provider]  predniSONE (DELTASONE) 5 MG tablet Take 6 tablets (30 mg total) by mouth daily with breakfast. May take for up to 5 days.  Do not take any NSAIDs with this medication. Take with food. 09/19/16   Hagler, Jami L, PA-C    Allergies Patient has no known allergies.  No family history on file.  Social History Social History   Tobacco Use  . Smoking status: Never Smoker  . Smokeless tobacco: Never Used  Substance Use Topics  . Alcohol use: No  . Drug use: Not on file    Review of Systems  Constitutional: Negative for fever. + syncope Eyes: Negative for visual changes. ENT: Negative for sore throat. Neck: No neck pain  Cardiovascular: Negative for chest pain. Respiratory: Negative for shortness of breath. Gastrointestinal: Negative for abdominal pain, vomiting or diarrhea. Genitourinary: Negative for dysuria. Musculoskeletal: Negative for back pain. Skin: Negative for rash. Neurological: Negative for headaches, weakness or numbness. Psych: No SI or HI. + depression  ____________________________________________   PHYSICAL EXAM:  VITAL SIGNS: ED Triage Vitals  Enc Vitals Group     BP 12/08/17 2020 (!) 147/78     Pulse Rate 12/08/17 2020 86     Resp 12/08/17 2020 18     Temp 12/08/17 2020 99.1 F (37.3 C)  Temp Source 12/08/17 2020 Oral     SpO2 12/08/17 2020 96 %     Weight 12/08/17 2020 187 lb 2.7 oz (84.9 kg)     Height --      Head Circumference --      Peak Flow --      Pain Score 12/08/17 2015 5     Pain Loc --      Pain Edu? --      Excl. in GC? --     Constitutional: Alert and oriented. Well appearing and in no apparent distress. HEENT:      Head: Normocephalic and  atraumatic.         Eyes: Conjunctivae are normal. Sclera is non-icteric.       Mouth/Throat: Mucous membranes are moist.       Neck: Supple with no signs of meningismus. Cardiovascular: Regular rate and rhythm. No murmurs, gallops, or rubs. 2+ symmetrical distal pulses are present in all extremities. No JVD. Respiratory: Normal respiratory effort. Lungs are clear to auscultation bilaterally. No wheezes, crackles, or rhonchi.  Gastrointestinal: Soft, non tender, and non distended with positive bowel sounds. No rebound or guarding. Genitourinary: No CVA tenderness. Musculoskeletal: Nontender with normal range of motion in all extremities. No edema, cyanosis, or erythema of extremities. Neurologic: Normal speech and language. Face is symmetric. Moving all extremities. No gross focal neurologic deficits are appreciated. Skin: Skin is warm, dry and intact. No rash noted. Psychiatric: Mood and affect are normal. Speech and behavior are normal.  ____________________________________________   LABS (all labs ordered are listed, but only abnormal results are displayed)  Labs Reviewed  CBC - Abnormal; Notable for the following components:      Result Value   WBC 3.1 (*)    All other components within normal limits  COMPREHENSIVE METABOLIC PANEL - Abnormal; Notable for the following components:   Glucose, Bld 120 (*)    AST 51 (*)    Alkaline Phosphatase 134 (*)    All other components within normal limits  URINALYSIS, COMPLETE (UACMP) WITH MICROSCOPIC - Abnormal; Notable for the following components:   Color, Urine YELLOW (*)    APPearance CLEAR (*)    Bacteria, UA FEW (*)    Squamous Epithelial / LPF 0-5 (*)    All other components within normal limits  URINE DRUG SCREEN, QUALITATIVE ([facility] ONLY) - Abnormal; Notable for the following components:   Tricyclic, Ur Screen POSITIVE (*)    All other components within normal limits  ETHANOL  TROPONIN I    ____________________________________________  EKG  ED ECG REPORT I, Nita Sicklearolina Nelwyn Hebdon, the attending physician, personally viewed and interpreted this ECG.  Normal sinus rhythm, normal intervals, normal axis, no STE or depressions, no evidence of HOCM, AV block, delta wave, ARVD, prolonged QTc, WPW, or Brugada.  ____________________________________________  RADIOLOGY  none  ____________________________________________   PROCEDURES  Procedure(s) performed: None Procedures Critical Care performed:  None ____________________________________________   INITIAL IMPRESSION / ASSESSMENT AND PLAN / ED COURSE  62 y.o. female with a history of depression who presents for evaluation of worsening depression. No SI or HI. Daughter agrees the patient has not expressed any suicidality. Patient has been taking her citalopram 20 mg daily. Recommended they try to increase her dose to 30 mg daily and call the psychiatrist they were referred to for follow-up appointment. Labs and EKG with no acute findings. Patient does have a history of syncopal events in the setting of being very agitated and said. Discussed with  the daughter and the patient that if she feels suicidal or expresses any suicidal thoughts that they should call the crisis hotline and bring patient back to the emergency room immediately. At this time there is no indication for IVC. Patient is going to be discharged home to the care of her family.      As part of my medical decision making, I reviewed the following data within the electronic MEDICAL RECORD NUMBER Nursing notes reviewed and incorporated, Labs reviewed , EKG interpreted , Notes from prior ED visits and [place] Controlled Substance Database    Pertinent labs & imaging results that were available during my care of the patient were reviewed by me and considered in my medical decision making (see chart for details).    ____________________________________________   FINAL  CLINICAL IMPRESSION(S) / ED DIAGNOSES  Final diagnoses:  Episode of recurrent major depressive disorder, unspecified depression episode severity (HCC)  Syncope, unspecified syncope type      NEW MEDICATIONS STARTED DURING THIS VISIT:  ED Discharge Orders    None       Note:  This document was prepared using Dragon voice recognition software and may include unintentional dictation errors.    Nita Sickle, MD 12/08/17 2225

## 2017-12-08 NOTE — ED Notes (Signed)
Personal belongings given back to pt for pt to place back on. D/C instructions provided with interpreter present. Pt provides verbal understanding of instructions.

## 2017-12-08 NOTE — Discharge Instructions (Signed)
You have been seen in the Emergency Department (ED)  today for a psychiatric complaint.  You have been evaluated by psychiatry and we believe you are safe to be discharged from the hospital.   ° °Please return to the Emergency Department (ED)  immediately if you have ANY thoughts of hurting yourself or anyone else, so that we may help you. ° °Please avoid alcohol and drug use. ° °Follow up with your doctor and/or therapist as soon as possible regarding today's ED  visit.  ° °You may call crisis hotline for [place] at [phone]. ° °

## 2017-12-09 ENCOUNTER — Emergency Department
Admission: EM | Admit: 2017-12-09 | Discharge: 2017-12-09 | Disposition: A | Discharge status: Home / Self Care | Payer: Medicaid Other | Attending: Emergency Medicine | Admitting: Emergency Medicine

## 2017-12-09 ENCOUNTER — Encounter: Payer: Self-pay | Admitting: Emergency Medicine

## 2017-12-09 ENCOUNTER — Other Ambulatory Visit: Payer: Self-pay

## 2017-12-09 DIAGNOSIS — F341 Dysthymic disorder: Secondary | ICD-10-CM | POA: Diagnosis not present

## 2017-12-09 DIAGNOSIS — I10 Essential (primary) hypertension: Secondary | ICD-10-CM | POA: Diagnosis not present

## 2017-12-09 DIAGNOSIS — Z79899 Other long term (current) drug therapy: Secondary | ICD-10-CM | POA: Diagnosis not present

## 2017-12-09 DIAGNOSIS — F329 Major depressive disorder, single episode, unspecified: Secondary | ICD-10-CM | POA: Diagnosis present

## 2017-12-09 NOTE — ED Notes (Signed)
Placed a one rust orange shirt and bra and underwear and grey pants and a pair of grey and black shoes and a grey sweater and hair tie in the bag. And a label is present.

## 2017-12-09 NOTE — Discharge Instructions (Signed)
The psychiatrist wants you to take Celexa 30 mg at bedtime.please follow-up with [facility].  El psiquiatra quiere que tome el celexa 30 mg [facility] [employer] [facility]. Valla a [facility] para que la chequeen Armed forces training and education officerotra vez.

## 2017-12-09 NOTE — ED Provider Notes (Signed)
[facility] Emergency Department Provider Note   ____________________________________________   First MD Initiated Contact with Patient 12/09/17 [phone]     (approximate)  I have reviewed the triage vital signs and the nursing notes.   HISTORY  Chief Complaint Mental Health Problem    HPI Holly Carter is a 62 y.o. female Patient comes here under commitment for [facility]. Through translator patient reports she is taking antidepressants and have asked her doctor if her doctor could increase the dose because she didn't feel like they were working. Her doctor apparently sent her to the psychiatrist. Somehow the fifth fact that she 9 years ago was committed for thinking about running out in front of cars apparently got missed translated to him that she wants to do it now. Patient denies this repeatedly. Patient is having trouble sleeping isn't eating much and occasionally feels like she wants to take a drink to make herself feel better but she doesn't she says she drinks soda instead. She does have a history of depression. Again she repeatedly denies wanting to her thinking about hurting herself or anyone else. I will see if I can get S LOC 2 consult with her and see if we can adjust her medications.   Past Medical History:  Diagnosis Date  . Arthritis   . Depression   . Hypertension     There are no active problems to display for this patient.   History reviewed. No pertinent surgical history.  Prior to Admission medications   Medication Sig Start Date End Date Taking? Authorizing Provider  cyclobenzaprine (FLEXERIL) 5 MG tablet Take 1 tablet (5 mg total) by mouth 3 (three) times daily as needed for muscle spasms. 09/19/16   Hagler, Jami L, PA-C  hydrochlorothiazide (HYDRODIURIL) 12.5 MG tablet Take 1 tablet (12.5 mg total) by mouth daily. 09/19/16 10/19/16  Hagler, Jami L, PA-C  naproxen (NAPROSYN) 500 MG tablet Take 500 mg by mouth 2 (two) times daily with  a meal.    [provider]  predniSONE (DELTASONE) 5 MG tablet Take 6 tablets (30 mg total) by mouth daily with breakfast. May take for up to 5 days.  Do not take any NSAIDs with this medication. Take with food. 09/19/16   Hagler, Jami L, PA-C    Allergies Patient has no known allergies.  History reviewed. No pertinent family history.  Social History Social History   Tobacco Use  . Smoking status: Never Smoker  . Smokeless tobacco: Never Used  Substance Use Topics  . Alcohol use: No  . Drug use: Not on file    Review of Systems  Constitutional: No fever/chills Eyes: No visual changes. ENT: No sore throat. Cardiovascular: Denies chest pain. Respiratory: Denies shortness of breath. Gastrointestinal: No abdominal pain.  No nausea, no vomiting.  No diarrhea.  No constipation. Genitourinary: Negative for dysuria. Musculoskeletal: Negative for back pain. Skin: Negative for rash. Neurological: Negative for headaches, focal weakness  ____________________________________________   PHYSICAL EXAM:  VITAL SIGNS: ED Triage Vitals  Enc Vitals Group     BP 12/09/17 1905 128/63     Pulse Rate 12/09/17 1905 79     Resp 12/09/17 1905 20     Temp 12/09/17 1905 97.9 F (36.6 C)     Temp Source 12/09/17 1905 Oral     SpO2 12/09/17 1905 99 %     Weight 12/09/17 1904 187 lb (84.8 kg)     Height --      Head Circumference --  Peak Flow --      Pain Score --      Pain Loc --      Pain Edu? --      Excl. in GC? --     Constitutional: Alert and oriented. Well appearing and in no acute distress. Eyes: Conjunctivae are normal.  Head: Atraumatic. Nose: No congestion/rhinnorhea. Mouth/Throat: Mucous membranes are moist.  Oropharynx non-erythematous. Neck: No stridor.  Cardiovascular: Normal rate, regular rhythm. Grossly normal heart sounds.  Good peripheral circulation. Respiratory: Normal respiratory effort.  No retractions. Lungs CTAB. Gastrointestinal: Soft and  nontender. No distention. No abdominal bruits. No CVA tenderness. Musculoskeletal: No lower extremity tenderness nor edema.  No joint effusions. Neurologic:  Normal speech and language. No gross focal neurologic deficits are appreciated. Skin:  Skin is warm, dry and intact. No rash noted. Psychiatric: Mood and affect are normal. Speech and behavior are normal.  ____________________________________________   LABS (all labs ordered are listed, but only abnormal results are displayed)  Labs Reviewed - No data to display ____________________________________________  EKG   ____________________________________________  RADIOLOGY  ED MD interpretation:    Official radiology report(s): No results found.  ____________________________________________   PROCEDURES  Procedure(s) performed:   Procedures  Critical Care performed:  ____________________________________________   INITIAL IMPRESSION / ASSESSMENT AND PLAN / ED COURSE patella psychiatry reverses the commitment and says to increase his Celexa to 30 by mouth daily at bedtime follow up outpatient. I will do so.       ____________________________________________   FINAL CLINICAL IMPRESSION(S) / ED DIAGNOSES  Final diagnoses:  Dysthymia     ED Discharge Orders    None       Note:  This document was prepared using Dragon voice recognition software and may include unintentional dictation errors.    Arnaldo Natal, MD 12/09/17 2137

## 2017-12-09 NOTE — ED Notes (Signed)
EDT, Raynelle FanningJulie to triage to change pt into behav scrubs

## 2017-12-09 NOTE — ED Notes (Signed)
Pt discharged to home.  Family member driving.  Discharge instructions reviewed.  Verbalized understanding.  No questions or concerns at this time.  Teach back verified.  Pt in NAD.  No items left in ED.   

## 2017-12-09 NOTE — ED Triage Notes (Signed)
Patient ambulatory to triage with steady gait, without difficulty or distress noted, brought in for IVC [facility] [place] [facility] but [facility] interpreter; pt here last night for same

## 2023-08-18 ENCOUNTER — Other Ambulatory Visit: Payer: Self-pay | Admitting: Pediatrics

## 2023-08-18 DIAGNOSIS — M25562 Pain in left knee: Secondary | ICD-10-CM | POA: Diagnosis not present

## 2023-08-18 DIAGNOSIS — J9811 Atelectasis: Secondary | ICD-10-CM | POA: Diagnosis not present

## 2023-08-18 DIAGNOSIS — M542 Cervicalgia: Secondary | ICD-10-CM | POA: Diagnosis not present

## 2023-08-18 DIAGNOSIS — R1011 Right upper quadrant pain: Secondary | ICD-10-CM | POA: Diagnosis not present

## 2023-08-18 DIAGNOSIS — R1084 Generalized abdominal pain: Secondary | ICD-10-CM | POA: Diagnosis not present

## 2023-08-18 DIAGNOSIS — I517 Cardiomegaly: Secondary | ICD-10-CM | POA: Diagnosis not present

## 2023-08-18 DIAGNOSIS — M25561 Pain in right knee: Secondary | ICD-10-CM | POA: Diagnosis not present

## 2023-08-18 DIAGNOSIS — R8271 Bacteriuria: Secondary | ICD-10-CM | POA: Diagnosis not present

## 2023-08-18 DIAGNOSIS — G8929 Other chronic pain: Secondary | ICD-10-CM | POA: Diagnosis not present

## 2023-08-18 DIAGNOSIS — M25512 Pain in left shoulder: Secondary | ICD-10-CM | POA: Diagnosis not present

## 2023-08-18 DIAGNOSIS — R109 Unspecified abdominal pain: Secondary | ICD-10-CM | POA: Diagnosis not present

## 2023-08-18 DIAGNOSIS — R140 Abdominal distension (gaseous): Secondary | ICD-10-CM | POA: Diagnosis not present

## 2023-08-18 DIAGNOSIS — R1013 Epigastric pain: Secondary | ICD-10-CM | POA: Diagnosis not present

## 2023-08-18 DIAGNOSIS — M25511 Pain in right shoulder: Secondary | ICD-10-CM | POA: Diagnosis not present

## 2023-08-18 DIAGNOSIS — Z760 Encounter for issue of repeat prescription: Secondary | ICD-10-CM | POA: Diagnosis not present

## 2023-08-25 ENCOUNTER — Ambulatory Visit: Admission: RE | Admit: 2023-08-25 | Payer: 59 | Source: Ambulatory Visit

## 2023-08-26 ENCOUNTER — Emergency Department
Admission: EM | Admit: 2023-08-26 | Discharge: 2023-08-26 | Disposition: A | Discharge status: Home / Self Care | Payer: 59 | Attending: Emergency Medicine | Admitting: Emergency Medicine

## 2023-08-26 ENCOUNTER — Other Ambulatory Visit: Payer: Self-pay

## 2023-08-26 DIAGNOSIS — I10 Essential (primary) hypertension: Secondary | ICD-10-CM | POA: Insufficient documentation

## 2023-08-26 DIAGNOSIS — M25512 Pain in left shoulder: Secondary | ICD-10-CM | POA: Diagnosis not present

## 2023-08-26 MED ORDER — LOPERAMIDE HCL 2 MG PO TABS: 2.0000 mg | ORAL_TABLET | Freq: Four times a day (QID) | ORAL | 0 refills | Status: DC | PRN

## 2023-08-26 MED ORDER — KETOROLAC TROMETHAMINE 15 MG/ML IJ SOLN
15.0000 mg | Freq: Once | INTRAMUSCULAR | Status: AC
  Administered 2023-08-26: 15 mg via INTRAMUSCULAR
  Filled 2023-08-26: qty 1

## 2023-08-26 MED ORDER — MELOXICAM 15 MG PO TABS: 15.0000 mg | ORAL_TABLET | Freq: Every day | ORAL | 0 refills | Status: DC

## 2023-08-26 MED ORDER — MELOXICAM 15 MG PO TABS: 15.0000 mg | ORAL_TABLET | Freq: Every day | ORAL | 0 refills | Status: AC

## 2023-08-26 MED ORDER — DEXAMETHASONE SODIUM PHOSPHATE 10 MG/ML IJ SOLN
10.0000 mg | Freq: Once | INTRAMUSCULAR | Status: AC
  Administered 2023-08-26: 10 mg via INTRAMUSCULAR
  Filled 2023-08-26: qty 1

## 2023-08-26 NOTE — ED Triage Notes (Addendum)
Pt presents to ED with c/o of L shoulder pain that started a few years ago, pt stats HX of arthritis. Pt denies any injury.   Mobile Interpreter usedJudie Grieve [phone]

## 2023-08-26 NOTE — Discharge Instructions (Addendum)
Please take the meloxicam once a day for 2 weeks. You can take 650 mg of Tylenol every 6 hours as needed for pain.  Please follow-up with orthopedics, Dr. Audelia Acton whose information is attached.  This is the doctor that can do the joint injections.  If you continue to have pain you could also try going to [facility] urgent care which is an orthopedic specific urgent care.  Information is attached below.  [facility] [address] [place], [place] [address] Open every day 9am-9pm

## 2023-08-26 NOTE — ED Provider Notes (Signed)
[facility] Provider Note    Event Date/Time   First MD Initiated Contact with Patient 08/26/23 1658     (approximate)   History   Shoulder Pain   HPI  Holly Carter is a 67 y.o. female with PMH of arthritis, hypertension and depression presents for evaluation of left shoulder pain.  Patient states that she has been evaluated for this in [place] and was recommended to have surgery.  She has previously had joint injections and is wondering if she can have that done in the ER today.      Physical Exam   Triage Vital Signs: ED Triage Vitals  Encounter Vitals Group     BP 08/26/23 1607 137/71     Systolic BP Percentile --      Diastolic BP Percentile --      Pulse Rate 08/26/23 1607 72     Resp 08/26/23 1607 18     Temp 08/26/23 1607 98.9 F (37.2 C)     Temp Source 08/26/23 1607 Oral     SpO2 08/26/23 1607 93 %     Weight 08/26/23 1607 185 lb 3 oz (84 kg)     Height 08/26/23 1607 5\' 1"  (1.549 m)     Head Circumference --      Peak Flow --      Pain Score 08/26/23 1606 10     Pain Loc --      Pain Education --      Exclude from Growth Chart --     Most recent vital signs: Vitals:   08/26/23 1607  BP: 137/71  Pulse: 72  Resp: 18  Temp: 98.9 F (37.2 C)  SpO2: 93%    General: Awake, moderate distress due to pain. CV:  Good peripheral perfusion.  Resp:  Normal effort.  Abd:  No distention.  Other:  Radial pulse 2+ and regular.  Patient unable to perform any ROM due to pain.  Very difficult to assess patient's strength as she has very limited mobility.  Grip strength is equal bilaterally.  Tender to palpation surrounding the entire shoulder joint.   ED Results / Procedures / Treatments   Labs (all labs ordered are listed, but only abnormal results are displayed) Labs Reviewed - No data to display  PROCEDURES:  Critical Care performed: No  Procedures   MEDICATIONS ORDERED IN ED: Medications  ketorolac (TORADOL) 15  MG/ML injection 15 mg (has no administration in time range)  dexamethasone (DECADRON) injection 10 mg (has no administration in time range)     IMPRESSION / MDM / ASSESSMENT AND PLAN / ED COURSE  I reviewed the triage vital signs and the nursing notes.                             67 year old female presents for evaluation of left shoulder pain.  Vital signs stable in triage, patient in moderate distress due to pain on exam.  Differential diagnosis includes, but is not limited to, arthritis, muscle strain, cervical radiculopathy, biceps tendinitis.  Patient's presentation is most consistent with acute, uncomplicated illness.  I believe patient's shoulder pain is related to arthritis as she mentions having pain for few years and getting previous joint injections for this.  I explained to her that as an ER provider I would not do an intra-articular corticosteroid injection but I can refer her to an orthopedic doctor who would be able to do this.  While in the ED she was given toradol and dexamethasone for inflammation.  I also recommended that she take meloxicam and Tylenol regularly for pain.  Patient also mentions she has been having some diarrhea so I will send medication for this.  Patient was agreeable to plan, voiced understanding, all questions were answered and she was stable at discharge.      FINAL CLINICAL IMPRESSION(S) / ED DIAGNOSES   Final diagnoses:  Acute pain of left shoulder     Rx / DC Orders   ED Discharge Orders          Ordered    meloxicam (MOBIC) 15 MG tablet  Daily        08/26/23 1808    loperamide (IMODIUM A-D) 2 MG tablet  4 times daily PRN        08/26/23 1811             Note:  This document was prepared using Dragon voice recognition software and may include unintentional dictation errors.   Cameron Ali, PA-C 08/26/23 1811    Jene Every, MD 08/26/23 1921

## 2023-09-04 ENCOUNTER — Ambulatory Visit
Admission: RE | Admit: 2023-09-04 | Discharge: 2023-09-04 | Disposition: A | Discharge status: Home / Self Care | Payer: 59 | Source: Ambulatory Visit | Attending: Pediatrics | Admitting: Pediatrics

## 2023-09-04 DIAGNOSIS — R1013 Epigastric pain: Secondary | ICD-10-CM | POA: Diagnosis not present

## 2023-09-04 DIAGNOSIS — K8020 Calculus of gallbladder without cholecystitis without obstruction: Secondary | ICD-10-CM | POA: Diagnosis not present

## 2023-09-04 DIAGNOSIS — R1011 Right upper quadrant pain: Secondary | ICD-10-CM | POA: Diagnosis not present

## 2023-09-07 ENCOUNTER — Other Ambulatory Visit: Payer: Self-pay | Admitting: Student

## 2023-09-07 DIAGNOSIS — R1011 Right upper quadrant pain: Secondary | ICD-10-CM

## 2023-09-11 DIAGNOSIS — M170 Bilateral primary osteoarthritis of knee: Secondary | ICD-10-CM | POA: Diagnosis not present

## 2023-09-24 ENCOUNTER — Ambulatory Visit: Payer: Self-pay | Admitting: General Surgery

## 2023-09-24 DIAGNOSIS — K8010 Calculus of gallbladder with chronic cholecystitis without obstruction: Secondary | ICD-10-CM | POA: Diagnosis not present

## 2023-09-25 ENCOUNTER — Other Ambulatory Visit: Payer: Self-pay

## 2023-09-25 ENCOUNTER — Ambulatory Visit: Payer: Self-pay | Admitting: General Surgery

## 2023-09-25 ENCOUNTER — Encounter
Admission: RE | Admit: 2023-09-25 | Discharge: 2023-09-25 | Disposition: A | Discharge status: Home / Self Care | Payer: 59 | Source: Ambulatory Visit | Attending: General Surgery

## 2023-09-25 DIAGNOSIS — I10 Essential (primary) hypertension: Secondary | ICD-10-CM

## 2023-09-25 HISTORY — DX: Acute embolism and thrombosis of unspecified deep veins of unspecified lower extremity: I82.409

## 2023-09-25 HISTORY — DX: Personal history of urinary calculi: Z87.442

## 2023-09-25 HISTORY — DX: Prediabetes: R73.03

## 2023-09-25 HISTORY — DX: Calculus of gallbladder with chronic cholecystitis without obstruction: K80.10

## 2023-09-25 HISTORY — DX: Pneumonia, unspecified organism: J18.9

## 2023-09-25 NOTE — Patient Instructions (Addendum)
Your procedure is scheduled on: 09/28/23 - Monday Report to the Registration Desk on the 1st floor of the [facility]. To find out your arrival time, please call [phone] between 1PM - 3PM on: 09/25/23 - Friday If your arrival time is 6:00 am, do not arrive before that time as the [facility] entrance doors do not open until 6:00 am.  REMEMBER: Instructions that are not followed completely may result in serious medical risk, up to and including death; or upon the discretion of your surgeon and anesthesiologist your surgery may need to be rescheduled.  Do not eat food or drink any liquids after midnight the night before surgery.  No gum chewing or hard candies.  One week prior to surgery: Stop Anti-inflammatories (NSAIDS) such as Advil, Aleve, Ibuprofen, Motrin, Naproxen, Naprosyn and Aspirin based products such as Excedrin, Goody's Powder, BC Powder. You may take Tylenol if needed for pain up until the day of surgery.  Stop ANY OVER THE COUNTER supplements until after surgery.  ON THE DAY OF SURGERY ONLY TAKE THESE MEDICATIONS WITH SIPS OF WATER:  none   No Alcohol for 24 hours before or after surgery.  No Smoking including e-cigarettes for 24 hours before surgery.  No chewable tobacco products for at least 6 hours before surgery.  No nicotine patches on the day of surgery.  Do not use any "recreational" drugs for at least a week (preferably 2 weeks) before your surgery.  Please be advised that the combination of cocaine and anesthesia may have negative outcomes, up to and including death. If you test positive for cocaine, your surgery will be cancelled.  On the morning of surgery brush your teeth with toothpaste and water, you may rinse your mouth with mouthwash if you wish. Do not swallow any toothpaste or mouthwash.  Use CHG Soap or wipes as directed on instruction sheet.  Do not wear jewelry, make-up, hairpins, clips or nail polish.  For welded (permanent) jewelry:  bracelets, anklets, waist bands, etc.  Please have this removed prior to surgery.  If it is not removed, there is a chance that hospital personnel will need to cut it off on the day of surgery.  Do not wear lotions, powders, or perfumes.   Do not shave body hair from the neck down 48 hours before surgery.  Contact lenses, hearing aids and dentures may not be worn into surgery.  Do not bring valuables to the hospital. [facility] is not responsible for any missing/lost belongings or valuables.   Notify your doctor if there is any change in your medical condition (cold, fever, infection).  Wear comfortable clothing (specific to your surgery type) to the hospital.  After surgery, you can help prevent lung complications by doing breathing exercises.  Take deep breaths and cough every 1-2 hours. Your doctor may order a device called an Incentive Spirometer to help you take deep breaths. When coughing or sneezing, hold a pillow firmly against your incision with both hands. This is called "splinting." Doing this helps protect your incision. It also decreases belly discomfort.  If you are being admitted to the hospital overnight, leave your suitcase in the car. After surgery it may be brought to your room.  In case of increased patient census, it may be necessary for you, the patient, to continue your postoperative care in the Same Day Surgery department.  If you are being discharged the day of surgery, you will not be allowed to drive home. You will need a responsible  individual to drive you home and stay with you for 24 hours after surgery.   If you are taking public transportation, you will need to have a responsible individual with you.  Please call the Pre-admissions Testing Dept. at [phone] if you have any questions about these instructions.  Surgery Visitation Policy:  Patients having surgery or a procedure may have two visitors.  Children under the age of 58 must have an adult  with them who is not the patient.  Inpatient Visitation:    Visiting hours are 7 a.m. to 8 p.m. Up to four visitors are allowed at one time in a patient room. The visitors may rotate out with other people during the day.  One visitor age 60 or older may stay with the patient overnight and must be in the room by 8 p.m.     Preparing for Surgery with CHLORHEXIDINE GLUCONATE (CHG) Soap  Chlorhexidine Gluconate (CHG) Soap  o An antiseptic cleaner that kills germs and bonds with the skin to continue killing germs even after washing  o Used for showering the night before surgery and morning of surgery  Before surgery, you can play an important role by reducing the number of germs on your skin.  CHG (Chlorhexidine gluconate) soap is an antiseptic cleanser which kills germs and bonds with the skin to continue killing germs even after washing.  Please do not use if you have an allergy to CHG or antibacterial soaps. If your skin becomes reddened/irritated stop using the CHG.  1. Shower the NIGHT BEFORE SURGERY and the MORNING OF SURGERY with CHG soap.  2. If you choose to wash your hair, wash your hair first as usual with your normal shampoo.  3. After shampooing, rinse your hair and body thoroughly to remove the shampoo.  4. Use CHG as you would any other liquid soap. You can apply CHG directly to the skin and wash gently with a scrungie or a clean washcloth.  5. Apply the CHG soap to your body only from the neck down. Do not use on open wounds or open sores. Avoid contact with your eyes, ears, mouth, and genitals (private parts). Wash face and genitals (private parts) with your normal soap.  6. Wash thoroughly, paying special attention to the area where your surgery will be performed.  7. Thoroughly rinse your body with warm water.  8. Do not shower/wash with your normal soap after using and rinsing off the CHG soap.  9. Pat yourself dry with a clean towel.  10. Wear clean pajamas  to bed the night before surgery.  12. Place clean sheets on your bed the night of your first shower and do not sleep with pets.  13. Shower again with the CHG soap on the day of surgery prior to arriving at the hospital.  14. Do not apply any deodorants/lotions/powders.  15. Please wear clean clothes to the hospital.

## 2023-09-25 NOTE — H&P (Signed)
PATIENT PROFILE: Holly Carter is a 67 y.o. female who presents to the Clinic for consultation at the request of Daphane Shepherd, [place] for evaluation of cholelithiasis.  PCP:  Donato Heinz, NP  HISTORY OF PRESENT ILLNESS: Ms. Holly Carter reports that he has been having right upper quadrant pain since the last 46-month.  She endorses that the pain is in the right upper quadrant.  The pain radiates to the right back.  Pain is aggravated by eating food.  Pain is intermittent.  No specific alleviating factors.  Pain last for about 1520 minutes.  Pain can be severe.  She went to the ED due to the severity of the pain couple of weeks ago.  Patient had an ultrasound that shows cholelithiasis with mild gallbladder wall thickening.  They were concerned about cholecystitis but patient was referred to outpatient surgery clinic.  Patient denies any fever.   PROBLEM LIST: Problem List  Date Reviewed: 08/19/2023  None   GENERAL REVIEW OF SYSTEMS:   General ROS: negative for - chills, fatigue, fever, weight gain or weight loss Allergy and Immunology ROS: negative for - hives  Hematological and Lymphatic ROS: negative for - bleeding problems or bruising, negative for palpable nodes Endocrine ROS: negative for - heat or cold intolerance, hair changes Respiratory ROS: negative for - cough, shortness of breath or wheezing Cardiovascular ROS: no chest pain or palpitations GI ROS: Positive for nausea, abdominal pain Musculoskeletal ROS: negative for - joint swelling or muscle pain Neurological ROS: negative for - confusion, syncope Dermatological ROS: negative for pruritus and rash Psychiatric: negative for anxiety, depression, difficulty sleeping and memory loss  MEDICATIONS: Current Outpatient Medications  Medication Sig Dispense Refill   sertraline (ZOLOFT) 50 MG tablet Take 1 tablet (50 mg total) by mouth once daily for 90 days 30 tablet 2   diclofenac (VOLTAREN) 1 % topical gel Apply 2 g topically 4  (four) times daily To knees (Patient not taking: Reported on 09/24/2023) 100 g 0   No current facility-administered medications for this visit.    ALLERGIES: Patient has no known allergies.  PAST MEDICAL HISTORY: Past Medical History:  Diagnosis Date   Arthritis    Depression    Hypertension    Pre-diabetes     PAST SURGICAL HISTORY: History reviewed. No pertinent surgical history.   FAMILY HISTORY: No family history on file.   SOCIAL HISTORY: Social History   Socioeconomic History   Marital status: Married  Tobacco Use   Smoking status: Never   Smokeless tobacco: Never  Vaping Use   Vaping status: Never Used  Substance and Sexual Activity   Alcohol use: Not Currently   Drug use: Never   Sexual activity: Defer   Social Drivers of Health   Financial Resource Strain: Low Risk  (09/24/2023)   Overall Financial Resource Strain (CARDIA)    Difficulty of Paying Living Expenses: Not very hard  Food Insecurity: No Food Insecurity (09/24/2023)   Hunger Vital Sign    Worried About Running Out of Food in the Last Year: Never true    Ran Out of Food in the Last Year: Never true  Transportation Needs: No Transportation Needs (09/24/2023)   PRAPARE - Administrator, Civil Service (Medical): No    Lack of Transportation (Non-Medical): No    PHYSICAL EXAM: Vitals:   09/24/23 0944  BP: (!) 147/83  Pulse: 66   Body mass index is 34.13 kg/m. Weight: 76.7 kg (169 lb)   GENERAL: Alert, active, oriented  x3  HEENT: Pupils equal reactive to light. Extraocular movements are intact. Sclera clear. Palpebral conjunctiva normal red color.Pharynx clear.  NECK: Supple with no palpable mass and no adenopathy.  LUNGS: Sound clear with no rales rhonchi or wheezes.  HEART: Regular rhythm S1 and S2 without murmur.  ABDOMEN: Soft and depressible, nontender with no palpable mass, no hepatomegaly.   EXTREMITIES: Well-developed well-nourished symmetrical with no  dependent edema.  NEUROLOGICAL: Awake alert oriented, facial expression symmetrical, moving all extremities.  REVIEW OF DATA: I have reviewed the following data today: Initial consult on 08/18/2023  Component Date Value   WBC (White Blood Cell Co* 08/18/2023 3.0 (L)    RBC (Red Blood Cell Coun* 08/18/2023 4.23    Hemoglobin 08/18/2023 13.6    Hematocrit 08/18/2023 40.5    MCV (Mean Corpuscular Vo* 08/18/2023 95.7    MCH (Mean Corpuscular He* 08/18/2023 32.2 (H)    MCHC (Mean Corpuscular H* 08/18/2023 33.6    Platelet Count 08/18/2023 133 (L)    RDW-CV (Red Cell Distrib* 08/18/2023 14.9 (H)    MPV (Mean Platelet Volum* 08/18/2023 11.6    Neutrophils 08/18/2023 1.45 (L)    Lymphocytes 08/18/2023 1.19    Monocytes 08/18/2023 0.30    Eosinophils 08/18/2023 0.03    Basophils 08/18/2023 0.01    Neutrophil % 08/18/2023 48.7    Lymphocyte % 08/18/2023 39.9    Monocyte % 08/18/2023 10.1    Eosinophil % 08/18/2023 1.0    Basophil% 08/18/2023 0.3    Immature Granulocyte % 08/18/2023 0.0    Immature Granulocyte Cou* 08/18/2023 0.00    Glucose 08/18/2023 105    Sodium 08/18/2023 138    Potassium 08/18/2023 4.2    Chloride 08/18/2023 105    Carbon Dioxide (CO2) 08/18/2023 28.7    Urea Nitrogen (BUN) 08/18/2023 17    Creatinine 08/18/2023 0.5 (L)    Glomerular Filtration Ra* 08/18/2023 103    Calcium 08/18/2023 9.4    AST  08/18/2023 27    ALT  08/18/2023 32    Alk Phos (alkaline Phosp* 08/18/2023 81    Albumin 08/18/2023 4.4    Bilirubin, Total 08/18/2023 0.5    Protein, Total 08/18/2023 7.5    A/G Ratio 08/18/2023 1.4    Lipase 08/18/2023 23    Amylase 08/18/2023 39    Color 08/18/2023 Light Yellow    Clarity 08/18/2023 Clear    Specific Gravity 08/18/2023 1.021    pH, Urine 08/18/2023 5.5    Protein, Urinalysis 08/18/2023 Negative    Glucose, Urinalysis 08/18/2023 Negative    Ketones, Urinalysis 08/18/2023 Negative    Blood, Urinalysis 08/18/2023 Trace (!)    Nitrite,  Urinalysis 08/18/2023 Positive (!)    Leukocyte Esterase, Urin* 08/18/2023 Negative    Bilirubin, Urinalysis 08/18/2023 Negative    Urobilinogen, Urinalysis 08/18/2023 0.2    WBC, UA 08/18/2023 3    Red Blood Cells, Urinaly* 08/18/2023 <1    Bacteria, Urinalysis 08/18/2023 0-5    Squamous Epithelial Cell* 08/18/2023 1    Urine Culture, Routine -* 08/18/2023 Final report (!)    Result 1 - [facility] 08/18/2023 Escherichia coli (!)    Antimicrobial Susceptibi* 08/18/2023 Comment      ASSESSMENT: Ms. Holly Carter is a 67 y.o. female presenting for consultation for cholelithiasis with chronic cholecystitis.    Patient was oriented about the diagnosis of cholelithiasis. Also oriented about what is the gallbladder, its anatomy and function and the implications of having stones and ultrasound concerning for chronic cholecystitis. The patient was oriented  about the treatment alternatives (observation vs cholecystectomy). Patient was oriented that a low percentage of patient will continue to have similar pain symptoms even after the gallbladder is removed. Surgical technique (open vs laparoscopic) was discussed. It was also discussed the goals of the surgery (decrease the pain episodes and avoid the risk of cholecystitis) and the risk of surgery including: bleeding, infection, common bile duct injury, stone retention, injury to other organs such as bowel, liver, stomach, other complications such as hernia, bowel obstruction among others. Also discussed with patient about anesthesia and its complications such as: reaction to medications, pneumonia, heart complications, death, among others.   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  Robotic assisted laparoscopic cholecystectomy (09811) 2.  Do not take aspirin 5 days before the procedure 3.  Contact us if has any question or concern.   Patient verbalized understanding, all questions were answered, and were agreeable with the plan  outlined above.     Carolan Shiver, MD  Electronically signed by Carolan Shiver, MD

## 2023-09-25 NOTE — Pre-Procedure Instructions (Signed)
Spanish interpreter ID # [id number] . PAT call completed with patient and grand daughter

## 2023-09-27 MED ORDER — LACTATED RINGERS IV SOLN: INTRAVENOUS | Status: AC

## 2023-09-27 MED ORDER — INDOCYANINE GREEN 25 MG IV SOLR: 1.2500 mg | Freq: Once | INTRAVENOUS | Status: DC

## 2023-09-27 MED ORDER — CEFAZOLIN SODIUM-DEXTROSE 2-4 GM/100ML-% IV SOLN: 2.0000 g | INTRAVENOUS | Status: AC

## 2023-09-27 MED ORDER — CHLORHEXIDINE GLUCONATE 0.12 % MT SOLN
15.0000 mL | Freq: Once | OROMUCOSAL | Status: DC
Start: 2023-09-27 — End: 2023-10-05

## 2023-09-27 MED ORDER — ORAL CARE MOUTH RINSE: 15.0000 mL | Freq: Once | OROMUCOSAL | Status: DC

## 2023-09-28 ENCOUNTER — Encounter: Admission: RE | Payer: Self-pay | Source: Ambulatory Visit

## 2023-09-28 ENCOUNTER — Ambulatory Visit: Admission: RE | Admit: 2023-09-28 | Payer: 59 | Source: Ambulatory Visit | Admitting: General Surgery

## 2023-09-28 SURGERY — XI ROBOTIC ASSISTED LAPAROSCOPIC CHOLECYSTECTOMY
Anesthesia: General | Site: Abdomen

## 2023-10-06 ENCOUNTER — Emergency Department: Payer: 59

## 2023-10-06 ENCOUNTER — Encounter: Payer: Self-pay | Admitting: Emergency Medicine

## 2023-10-06 ENCOUNTER — Emergency Department
Admission: EM | Admit: 2023-10-06 | Discharge: 2023-10-06 | Disposition: A | Discharge status: Home / Self Care | Payer: 59 | Attending: Emergency Medicine | Admitting: Emergency Medicine

## 2023-10-06 ENCOUNTER — Other Ambulatory Visit: Payer: Self-pay

## 2023-10-06 DIAGNOSIS — K8020 Calculus of gallbladder without cholecystitis without obstruction: Secondary | ICD-10-CM | POA: Diagnosis not present

## 2023-10-06 DIAGNOSIS — K5730 Diverticulosis of large intestine without perforation or abscess without bleeding: Secondary | ICD-10-CM | POA: Diagnosis not present

## 2023-10-06 DIAGNOSIS — R1011 Right upper quadrant pain: Secondary | ICD-10-CM

## 2023-10-06 DIAGNOSIS — K769 Liver disease, unspecified: Secondary | ICD-10-CM | POA: Diagnosis not present

## 2023-10-06 DIAGNOSIS — N3000 Acute cystitis without hematuria: Secondary | ICD-10-CM | POA: Insufficient documentation

## 2023-10-06 DIAGNOSIS — R1010 Upper abdominal pain, unspecified: Secondary | ICD-10-CM | POA: Diagnosis not present

## 2023-10-06 LAB — CBC
HCT: 41.3 % (ref 36.0–46.0)
Hemoglobin: 14.1 g/dL (ref 12.0–15.0)
MCH: 32.6 pg (ref 26.0–34.0)
MCHC: 34.1 g/dL (ref 30.0–36.0)
MCV: 95.4 fL (ref 80.0–100.0)
Platelets: 121 10*3/uL — ABNORMAL LOW (ref 150–400)
RBC: 4.33 MIL/uL (ref 3.87–5.11)
RDW: 14.3 % (ref 11.5–15.5)
WBC: 2.6 10*3/uL — ABNORMAL LOW (ref 4.0–10.5)
nRBC: 0 % (ref 0.0–0.2)

## 2023-10-06 LAB — COMPREHENSIVE METABOLIC PANEL
ALT: 38 U/L (ref 0–44)
AST: 28 U/L (ref 15–41)
Albumin: 4.1 g/dL (ref 3.5–5.0)
Alkaline Phosphatase: 77 U/L (ref 38–126)
Anion gap: 8 (ref 5–15)
BUN: 23 mg/dL (ref 8–23)
CO2: 24 mmol/L (ref 22–32)
Calcium: 9 mg/dL (ref 8.9–10.3)
Chloride: 104 mmol/L (ref 98–111)
Creatinine, Ser: 0.66 mg/dL (ref 0.44–1.00)
GFR, Estimated: >60 mL/min (ref >60)
Glucose, Bld: 92 mg/dL (ref 70–99)
Potassium: 3.9 mmol/L (ref 3.5–5.1)
Sodium: 136 mmol/L (ref 135–145)
Total Bilirubin: 0.8 mg/dL (ref <1.2)
Total Protein: 6.9 g/dL (ref 6.5–8.1)

## 2023-10-06 LAB — URINALYSIS, ROUTINE W REFLEX MICROSCOPIC
Bilirubin Urine: NEGATIVE
Glucose, UA: NEGATIVE mg/dL
Hgb urine dipstick: NEGATIVE
Ketones, ur: NEGATIVE mg/dL
Nitrite: POSITIVE — AB
Protein, ur: NEGATIVE mg/dL
Specific Gravity, Urine: 1.008 (ref 1.005–1.030)
pH: 9 — ABNORMAL HIGH (ref 5.0–8.0)

## 2023-10-06 LAB — LIPASE, BLOOD: Lipase: 30 U/L (ref 11–51)

## 2023-10-06 MED ORDER — CEPHALEXIN 500 MG PO CAPS: 500.0000 mg | ORAL_CAPSULE | Freq: Two times a day (BID) | ORAL | 0 refills | Status: AC

## 2023-10-06 MED ORDER — MORPHINE SULFATE (PF) 4 MG/ML IV SOLN
4.0000 mg | Freq: Once | INTRAVENOUS | Status: AC
  Administered 2023-10-06: 4 mg via INTRAVENOUS
  Filled 2023-10-06: qty 1

## 2023-10-06 MED ORDER — IOHEXOL 300 MG/ML  SOLN
100.0000 mL | Freq: Once | INTRAMUSCULAR | Status: AC | PRN
  Administered 2023-10-06: 100 mL via INTRAVENOUS

## 2023-10-06 MED ORDER — ONDANSETRON 4 MG PO TBDP: 4.0000 mg | ORAL_TABLET | Freq: Three times a day (TID) | ORAL | 0 refills | Status: DC | PRN

## 2023-10-06 MED ORDER — ONDANSETRON HCL 4 MG/2ML IJ SOLN
4.0000 mg | Freq: Once | INTRAMUSCULAR | Status: AC
  Administered 2023-10-06: 4 mg via INTRAVENOUS
  Filled 2023-10-06: qty 2

## 2023-10-06 MED ORDER — TRAMADOL HCL 50 MG PO TABS: 50.0000 mg | ORAL_TABLET | Freq: Four times a day (QID) | ORAL | 0 refills | Status: DC | PRN

## 2023-10-06 NOTE — ED Triage Notes (Addendum)
Pt states that she is having right sided abd pain, pt states that she has gallstones, ot is waiting for surg, the dr who was doing it had an emergency and had to cancel, states today the pain is too bad and it is the same pain she had before, pt reports nausea, no vomiting  Pt reports last meal at 1700 yesterday, couple sips of water this am

## 2023-10-06 NOTE — ED Provider Notes (Signed)
[facility] Provider Note    Event Date/Time   First MD Initiated Contact with Patient 10/06/23 [phone]     (approximate)   History   Abdominal Pain  Spanish interpreter used HPI  Holly Carter is a 67 y.o. female who presents with complaints of abdominal pain.  Patient describes right upper abdominal pain.  Had cholecystectomy scheduled on November 25 but it had to be canceled by surgeon.  Reports pain became severe overnight, same pain she has been having     Physical Exam   Triage Vital Signs: ED Triage Vitals  Encounter Vitals Group     BP 10/06/23 0939 (!) 142/77     Systolic BP Percentile --      Diastolic BP Percentile --      Pulse Rate 10/06/23 0939 70     Resp 10/06/23 0939 16     Temp --      Temp src --      SpO2 10/06/23 0939 100 %     Weight 10/06/23 0938 75.8 kg (167 lb)     Height 10/06/23 0938 1.549 m (5\' 1" )     Head Circumference --      Peak Flow --      Pain Score 10/06/23 0939 10     Pain Loc --      Pain Education --      Exclude from Growth Chart --     Most recent vital signs: Vitals:   10/06/23 0939 10/06/23 1028  BP: (!) 142/77 139/68  Pulse: 70 60  Resp: 16 18  SpO2: 100% 96%     General: Awake, uncomfortable CV:  Good peripheral perfusion.  Resp:  Normal effort.  Abd:  No distention.  Tenderness in the right upper quadrant Other:     ED Results / Procedures / Treatments   Labs (all labs ordered are listed, but only abnormal results are displayed) Labs Reviewed  CBC - Abnormal; Notable for the following components:      Result Value   WBC 2.6 (*)    Platelets 121 (*)    All other components within normal limits  URINALYSIS, ROUTINE W REFLEX MICROSCOPIC - Abnormal; Notable for the following components:   Color, Urine YELLOW (*)    APPearance CLEAR (*)    pH 9.0 (*)    Nitrite POSITIVE (*)    Leukocytes,Ua TRACE (*)    Bacteria, UA MANY (*)    All other components within normal limits   LIPASE, BLOOD  COMPREHENSIVE METABOLIC PANEL     EKG     RADIOLOGY Ultrasound viewed interpreted by me with cholelithiasis but no evidence of cholecystitis    PROCEDURES:  Critical Care performed:   Procedures   MEDICATIONS ORDERED IN ED: Medications  morphine (PF) 4 MG/ML injection 4 mg (has no administration in time range)  morphine (PF) 4 MG/ML injection 4 mg (4 mg Intravenous Given 10/06/23 1026)  ondansetron (ZOFRAN) injection 4 mg (4 mg Intravenous Given 10/06/23 1023)  iohexol (OMNIPAQUE) 300 MG/ML solution 100 mL (100 mLs Intravenous Contrast Given 10/06/23 1434)     IMPRESSION / MDM / ASSESSMENT AND PLAN / ED COURSE  I reviewed the triage vital signs and the nursing notes. Patient's presentation is most consistent with acute presentation with potential threat to life or bodily function.  Patient presents with right upper quadrant pain with known cholelithiasis.  She is uncomfortable and clearly in pain, will treat with IV morphine, IV Zofran,  obtain ultrasound to determine whether emergent cholecystectomy is necessary.  Lab work reviewed and overall reassuring, LFTs are normal  Ultrasound with cholelithiasis but no evidence of cholecystitis, will send for CT given continued pain  Have asked my colleague to follow-up on ultrasound results      FINAL CLINICAL IMPRESSION(S) / ED DIAGNOSES   Final diagnoses:  Right upper quadrant abdominal pain     Rx / DC Orders   ED Discharge Orders     None        Note:  This document was prepared using Dragon voice recognition software and may include unintentional dictation errors.   Jene Every, MD 10/06/23 1455

## 2023-10-06 NOTE — ED Provider Notes (Signed)
-----------------------------------------   4:45 PM on 10/06/2023 -----------------------------------------  I took over care of this patient from Dr. Cyril Loosen.  CT shows no evidence of acute cholecystitis or other acute findings.  IMPRESSION:  1. No acute intra-abdominal or pelvic pathology.  2. Cholelithiasis.  3. Colonic diverticulosis. No bowel obstruction.  4.  Aortic Atherosclerosis (ICD10-I70.0).   On reassessment, the patient appears well.  She states that her pain is improved.  Overall presentation is consistent with cholelithiasis and biliary colic.  There is no evidence of acute cholecystitis.  She is stable for discharge at this time.  I counseled her extensively on the results of the workup and plan of care via the in person Spanish interpreter.  I discussed the case with Dr. Maia Plan who confirmed that he had canceled the surgery last month because of paternity leave but has rescheduled it for 12/16.  I informed the patient of this follow-up plan.  I answered all of her questions and gave strict return precautions; she expressed understanding and agreement.   Dionne Bucy, MD 10/06/23 [phone]

## 2023-10-06 NOTE — ED Notes (Signed)
See triage notes. Patient c/o right sided abdominal pain. Hx of gallstones. Patient was scheduled for surgery but this was cancelled by the surgean due to an emergency.

## 2023-10-06 NOTE — Discharge Instructions (Addendum)
Comunquese con el consultorio del Dr. Lisabeth Devoid para confirmar la fecha de su ciruga.  Regrese a la sala de emergencias si presenta dolor intenso, vmitos, fiebre, debilidad o cualquier otro sntoma nuevo o que [employer], que sea nuevo, que empeore o que sea persistente.

## 2023-10-07 ENCOUNTER — Other Ambulatory Visit: Payer: Self-pay

## 2023-10-07 ENCOUNTER — Observation Stay
Admission: EM | Admit: 2023-10-07 | Discharge: 2023-10-09 | Disposition: A | Discharge status: Home / Self Care | Payer: 59 | Attending: Surgery | Admitting: Surgery

## 2023-10-07 DIAGNOSIS — K811 Chronic cholecystitis: Secondary | ICD-10-CM | POA: Diagnosis not present

## 2023-10-07 DIAGNOSIS — K8010 Calculus of gallbladder with chronic cholecystitis without obstruction: Secondary | ICD-10-CM | POA: Diagnosis not present

## 2023-10-07 DIAGNOSIS — K8080 Other cholelithiasis without obstruction: Principal | ICD-10-CM

## 2023-10-07 DIAGNOSIS — I10 Essential (primary) hypertension: Secondary | ICD-10-CM

## 2023-10-07 DIAGNOSIS — K5730 Diverticulosis of large intestine without perforation or abscess without bleeding: Secondary | ICD-10-CM | POA: Diagnosis not present

## 2023-10-07 DIAGNOSIS — K819 Cholecystitis, unspecified: Secondary | ICD-10-CM

## 2023-10-07 DIAGNOSIS — K8020 Calculus of gallbladder without cholecystitis without obstruction: Secondary | ICD-10-CM | POA: Diagnosis not present

## 2023-10-07 DIAGNOSIS — R1011 Right upper quadrant pain: Secondary | ICD-10-CM | POA: Diagnosis present

## 2023-10-07 HISTORY — PX: CHOLECYSTECTOMY: SHX55

## 2023-10-07 LAB — URINALYSIS, ROUTINE W REFLEX MICROSCOPIC
Bilirubin Urine: NEGATIVE
Glucose, UA: NEGATIVE mg/dL
Ketones, ur: NEGATIVE mg/dL
Leukocytes,Ua: NEGATIVE
Nitrite: NEGATIVE
Protein, ur: NEGATIVE mg/dL
Specific Gravity, Urine: 1.011 (ref 1.005–1.030)
pH: 7 (ref 5.0–8.0)

## 2023-10-07 LAB — CBC
HCT: 41.5 % (ref 36.0–46.0)
Hemoglobin: 14.1 g/dL (ref 12.0–15.0)
MCH: 32.5 pg (ref 26.0–34.0)
MCHC: 34 g/dL (ref 30.0–36.0)
MCV: 95.6 fL (ref 80.0–100.0)
Platelets: 127 10*3/uL — ABNORMAL LOW (ref 150–400)
RBC: 4.34 MIL/uL (ref 3.87–5.11)
RDW: 14.2 % (ref 11.5–15.5)
WBC: 2.8 10*3/uL — ABNORMAL LOW (ref 4.0–10.5)
nRBC: 0 % (ref 0.0–0.2)

## 2023-10-07 LAB — COMPREHENSIVE METABOLIC PANEL
ALT: 34 U/L (ref 0–44)
AST: 26 U/L (ref 15–41)
Albumin: 4.1 g/dL (ref 3.5–5.0)
Alkaline Phosphatase: 69 U/L (ref 38–126)
Anion gap: 8 (ref 5–15)
BUN: 24 mg/dL — ABNORMAL HIGH (ref 8–23)
CO2: 24 mmol/L (ref 22–32)
Calcium: 9.2 mg/dL (ref 8.9–10.3)
Chloride: 103 mmol/L (ref 98–111)
Creatinine, Ser: 0.73 mg/dL (ref 0.44–1.00)
GFR, Estimated: >60 mL/min (ref >60)
Glucose, Bld: 109 mg/dL — ABNORMAL HIGH (ref 70–99)
Potassium: 3.8 mmol/L (ref 3.5–5.1)
Sodium: 135 mmol/L (ref 135–145)
Total Bilirubin: 0.4 mg/dL (ref <1.2)
Total Protein: 7.1 g/dL (ref 6.5–8.1)

## 2023-10-07 LAB — TROPONIN I (HIGH SENSITIVITY): Troponin I (High Sensitivity): 3 ng/L (ref <18)

## 2023-10-07 LAB — LIPASE, BLOOD: Lipase: 31 U/L (ref 11–51)

## 2023-10-07 MED ORDER — ONDANSETRON HCL 4 MG/2ML IJ SOLN
4.0000 mg | Freq: Once | INTRAMUSCULAR | Status: AC
Start: 2023-10-07 — End: 2023-10-07
  Administered 2023-10-07: 4 mg via INTRAVENOUS
  Filled 2023-10-07: qty 2

## 2023-10-07 MED ORDER — FENTANYL CITRATE PF 50 MCG/ML IJ SOSY
50.0000 ug | PREFILLED_SYRINGE | Freq: Once | INTRAMUSCULAR | Status: AC
  Administered 2023-10-07: 50 ug via INTRAVENOUS
  Filled 2023-10-07: qty 1

## 2023-10-07 MED ORDER — PIPERACILLIN-TAZOBACTAM 3.375 G IVPB
3.3750 g | Freq: Once | INTRAVENOUS | Status: AC
  Administered 2023-10-07: 3.375 g via INTRAVENOUS
  Filled 2023-10-07: qty 50

## 2023-10-07 NOTE — ED Notes (Signed)
Pt declining IV and ordered pain medications at this time. Pt verbalized understanding of purpose of medications but expresses it is only temporary relief, and she is wanting a more permanent solution. Dr Fuller Plan made aware and plans to call gen surg to try and expedite planned cholecystectomy.

## 2023-10-07 NOTE — ED Provider Notes (Signed)
[facility] Provider Note    Event Date/Time   First MD Initiated Contact with Patient 10/07/23 1056     (approximate)   History   Abdominal Pain   HPI  Lyle Pruyn is a 67 y.o. female who with plan cholecystectomy who comes in with continued right upper quadrant pain nausea, vomiting.  I reviewed patient's note and she was seen here yesterday.  She had ultrasound with cholelithiasis and otherwise negative CT imaging.  The doctor had discussed the case with Dr. Maia Plan in the surgery scanned old outpatient for 12/16.  She was discharged with antibiotics, Zofran, tramadol.  Patient reports that the pain never went away when she was seen yesterday and that she went home and ate some oatmeal around 7 PM then she woke up around 2 AM with worsening pain.  She reports continued constant pain since then without any relief in symptoms.  She states that she did not pick up her medications from the pharmacy because they were not ready yet so she has not taken any of those.  The pain still remains severe which is why she presented today.  She denies any overt chest pain, shortness of breath or lower abdominal pain.  She reports the pain is in her right upper abdomen and radiates around to her back.   Spanish interpretor was used.      Physical Exam   Triage Vital Signs: ED Triage Vitals  Encounter Vitals Group     BP 10/07/23 1045 132/87     Systolic BP Percentile --      Diastolic BP Percentile --      Pulse Rate 10/07/23 1045 70     Resp 10/07/23 1045 16     Temp 10/07/23 1045 98.7 F (37.1 C)     Temp Source 10/07/23 1045 Oral     SpO2 10/07/23 1045 98 %     Weight 10/07/23 1042 167 lb (75.8 kg)     Height 10/07/23 1042 5\' 1"  (1.549 m)     Head Circumference --      Peak Flow --      Pain Score 10/07/23 1042 8     Pain Loc --      Pain Education --      Exclude from Growth Chart --     Most recent vital signs: Vitals:   10/07/23 1045  BP:  132/87  Pulse: 70  Resp: 16  Temp: 98.7 F (37.1 C)  SpO2: 98%     General: Awake, no distress.  CV:  Good peripheral perfusion.  Resp:  Normal effort.  Abd:  No distention.  Other:  No rash noted.  Some tenderness in the right upper quadrant.   ED Results / Procedures / Treatments   Labs (all labs ordered are listed, but only abnormal results are displayed) Labs Reviewed  LIPASE, BLOOD  COMPREHENSIVE METABOLIC PANEL  CBC  URINALYSIS, ROUTINE W REFLEX MICROSCOPIC     EKG  My interpretation of EKG:  Normal sinus rate of 69 without any ST elevation or T wave inversions, normal intervals  RADIOLOGY I have reviewed the ultrasound personally interpreted from yesterday patient has gallstones noted PROCEDURES:  Critical Care performed: No  Procedures   MEDICATIONS ORDERED IN ED: Medications - No data to display   IMPRESSION / MDM / ASSESSMENT AND PLAN / ED COURSE  I reviewed the triage vital signs and the nursing notes.   Patient's presentation is most consistent with acute  presentation with potential threat to life or bodily function.   Patient comes in with concerns for abdominal pain with known gallstones.  Will get EKG, cardiac markers to make sure no ACS.  The patient given some IV fentanyl IV Zofran to help with symptoms.  Will get repeat blood work to make sure no signs of choledocholithiasis, gallstone pancreatitis.  Troponin was negative, lipase normal CMP reassuring.  White count slightly low similar to prior.  Discussed the case with Dr. Tonna Boehringer from surgery.  Given the continued discomfort recommended admission for cholecystectomy.  Patient initially declined pain medications given she reports she was still having pain even with them.  After explained to her that she would be going to surgery and this the pain medication can help her feel better she expressed understanding felt comfortable with pain medication.  Recommended her stay NPO.  She denies any blood  thinners.  Given the continued pain I suspect patient has underlying cholecystitis not seen on [place]  and patient were given a dose of Zosyn while waiting for surgery    FINAL CLINICAL IMPRESSION(S) / ED DIAGNOSES   Final diagnoses:  Biliary calculus of other site without obstruction  Cholecystitis     Rx / DC Orders   ED Discharge Orders     None        Note:  This document was prepared using Dragon voice recognition software and may include unintentional dictation errors.   Concha Se, MD 10/07/23 1215

## 2023-10-07 NOTE — ED Triage Notes (Addendum)
Right sided abdominal pain. Seen through ED yesterday for the same.  Patient c/o worsening pain and vomiting today.    Has surgery scheduled for 12/16.

## 2023-10-07 NOTE — ED Notes (Addendum)
Dr Tonna Boehringer at bedside to update patient. Surgery planned for tomorrow.

## 2023-10-07 NOTE — ED Notes (Signed)
Gen surg at bedside

## 2023-10-07 NOTE — Progress Notes (Signed)
Subjective:   CC: chronic cholecystitis  HPI:  Holly Carter is a 67 y.o. female who is consulted by [facility] for evaluation of above cc.    Chronic RUQ pain, scheduled for elective surgery in the next few days but presented to ED last night and then again this am for recurring pain.  Pain mostly on right side, but also reports suprapubic pain as well.    Past Medical History:  has a past medical history of Arthritis, CCC (chronic calculous cholecystitis), Depression, DVT, lower extremity (HCC), History of kidney stones, Hypertension, Pneumonia, and Pre-diabetes.  Past Surgical History:  has a past surgical history that includes Abdominal hysterectomy; lithotripsy with stent; and Appendectomy.  Family History: family history is not on file.  Social History:  reports that she has never smoked. She has never used smokeless tobacco. She reports current alcohol use. She reports that she does not use drugs.  Current Medications:  Prior to Admission medications   Medication Sig Start Date End Date Taking? Authorizing Provider  cephALEXin (KEFLEX) 500 MG capsule Take 1 capsule (500 mg total) by mouth 2 (two) times daily for 7 days. 10/06/23 10/13/23  Dionne Bucy, MD  Multiple Vitamin (MULTIVITAMIN) tablet Take 1 tablet by mouth daily.    [provider]  ondansetron (ZOFRAN-ODT) 4 MG disintegrating tablet Take 1 tablet (4 mg total) by mouth every 8 (eight) hours as needed. 10/06/23   Dionne Bucy, MD  sertraline (ZOLOFT) 50 MG tablet Take 50 mg by mouth daily.    [provider]  traMADol (ULTRAM) 50 MG tablet Take 1 tablet (50 mg total) by mouth every 6 (six) hours as needed. 10/06/23 10/05/24  Dionne Bucy, MD    Allergies:  Allergies as of 10/07/2023   (No Known Allergies)       Objective:     BP (!) 140/67   Pulse (!) 59   Temp 98.7 F (37.1 C) (Oral)   Resp 17   Ht 5\' 1"  (1.549 m)   Wt 75.8 kg   SpO2 98%   BMI 31.55 kg/m     Constitutional :  alert, cooperative, appears stated age, and no distress  Lymphatics/Throat:  no asymmetry, masses, or scars  Respiratory:  clear to auscultation bilaterally  Cardiovascular:  regular rate and rhythm  Gastrointestinal: Soft, some voluntary guarding with palpation, RUQ. Some TTP in suparpubic area as well .   Musculoskeletal: Steady movement  Skin: Cool and moist  Psychiatric: Normal affect, non-agitated, not confused       LABS:     Latest Ref Rng & Units 10/07/2023   10:47 AM 10/06/2023    9:43 AM 12/08/2017    8:13 PM  CMP  Glucose 70 - 99 mg/dL 161  92  096   BUN 8 - 23 mg/dL 24  23  14    Creatinine 0.44 - 1.00 mg/dL 0.45  4.09  8.11   Sodium 135 - 145 mmol/L 135  136  139   Potassium 3.5 - 5.1 mmol/L 3.8  3.9  3.7   Chloride 98 - 111 mmol/L 103  104  104   CO2 22 - 32 mmol/L 24  24  27    Calcium 8.9 - 10.3 mg/dL 9.2  9.0  9.2   Total Protein 6.5 - 8.1 g/dL 7.1  6.9  7.7   Total Bilirubin <1.2 mg/dL 0.4  0.8  0.6   Alkaline Phos 38 - 126 U/L 69  77  134   AST 15 -  41 U/L 26  28  51   ALT 0 - 44 U/L 34  38  46       Latest Ref Rng & Units 10/07/2023   10:47 AM 10/06/2023    9:43 AM 12/08/2017    8:13 PM  CBC  WBC 4.0 - 10.5 K/uL 2.8  2.6  3.1   Hemoglobin 12.0 - 15.0 g/dL 09.6  04.5  40.9   Hematocrit 36.0 - 46.0 % 41.5  41.3  40.1   Platelets 150 - 400 K/uL 127  121  170      RADS: CLINICAL DATA:  Abdominal pain none.  History of gallstones.   EXAM: CT ABDOMEN AND PELVIS WITH CONTRAST   TECHNIQUE: Multidetector CT imaging of the abdomen and pelvis was performed using the standard protocol following bolus administration of intravenous contrast.   RADIATION DOSE REDUCTION: This exam was performed according to the departmental dose-optimization program which includes automated exposure control, adjustment of the mA and/or kV according to patient size and/or use of iterative reconstruction technique.   CONTRAST:  OMNIPAQUE IOHEXOL 300  MG/ML  SOLN   COMPARISON:  CT abdomen pelvis dated 07/18/2017.   FINDINGS: Lower chest: The visualized lung bases are clear.   No intra-abdominal free air or free fluid.   Hepatobiliary: Subcentimeter hypodense focus in the left lobe of the liver is too small to characterize. The liver is otherwise unremarkable. No biliary dilatation. Layering small gallstones. No pericholecystic fluid or evidence of acute cholecystitis by CT.   Pancreas: Unremarkable. No pancreatic ductal dilatation or surrounding inflammatory changes.   Spleen: Normal in size without focal abnormality.   Adrenals/Urinary Tract: The adrenal glands unremarkable. The kidneys, visualized ureters, and urinary bladder appear unremarkable.   Stomach/Bowel: There is sigmoid diverticulosis and scattered colonic diverticula without active inflammatory changes. There is no bowel obstruction or active inflammation. Appendectomy.   Vascular/Lymphatic: Mild aortoiliac atherosclerotic disease. The the IVC is unremarkable. No portal venous gas. There is no adenopathy.   Reproductive: Hysterectomy.  No adnexal masses.   Other: None   Musculoskeletal: No acute or significant osseous findings.   IMPRESSION: 1. No acute intra-abdominal or pelvic pathology. 2. Cholelithiasis. 3. Colonic diverticulosis. No bowel obstruction. 4.  Aortic Atherosclerosis (ICD10-I70.0).     Electronically Signed   By: Elgie Collard M.D.   On: 10/06/2023 15:33  CLINICAL DATA:  Upper abdominal pain   EXAM: ULTRASOUND ABDOMEN LIMITED RIGHT UPPER QUADRANT   COMPARISON:  CT abdomen pelvis 07/18/2017   FINDINGS: Gallbladder:   Cholelithiasis. No gallbladder wall thickening or pericholecystic fluid. Negative sonographic Murphy's sign.   Common bile duct:   Diameter: 5.3 mm   Liver:   Heterogeneous increased hepatic parenchymal echogenicity. There is a 9 x 9 x 6 mm echogenic lesion in the right hepatic lobe. Portal vein is  patent on color Doppler imaging with normal direction of blood flow towards the liver.   Other: None.   IMPRESSION: 1. Cholelithiasis without secondary signs of acute cholecystitis. 2. Heterogeneous increased hepatic parenchymal echogenicity suggestive of steatosis. 3. There is a 9 mm echogenic lesion in the right hepatic lobe. This is nonspecific but may represent a hemangioma. Recommend follow-up ultrasound in 6 months to assess for stability.     Electronically Signed   By: Annia Belt M.D.   On: 10/06/2023 13:27   Assessment:      Chronic cholecystitis- suprapubic pain is somewhat atypical, but CT shows no other obvious pathology, and pt states pain is  more on right side.  Recommended proceeding with robotic lap chole as previously discussed in clinic.  Plan:      Discussed the risk of surgery again including post-op infxn, seroma, biloma, chronic pain, poor-delayed wound healing, retained gallstone, conversion to open procedure, post-op SBO or ileus, and need for additional procedures to address said risks.  The risks of general anesthetic including MI, CVA, sudden death or even reaction to anesthetic medications also discussed. Alternatives include continued observation.  Benefits include possible symptom relief, prevention of complications including acute cholecystitis, pancreatitis.  Typical post operative recovery of 3-5 days rest, continued pain in area and incision sites, possible loose stools up to 4-6 weeks, also discussed.  The patient understands the risks, any and all questions were answered to the patient's satisfaction.  Admit, pain control, IV abx in the meantime.  discussed with primary surgeon dr. Maia Plan and he will proceed with surgery.    labs/images/medications/previous chart entries reviewed personally and relevant changes/updates noted above.

## 2023-10-08 ENCOUNTER — Inpatient Hospital Stay: Payer: 59 | Admitting: Anesthesiology

## 2023-10-08 ENCOUNTER — Ambulatory Visit: Admission: RE | Admit: 2023-10-08 | Payer: 59 | Source: Ambulatory Visit | Admitting: General Surgery

## 2023-10-08 ENCOUNTER — Other Ambulatory Visit: Payer: Self-pay

## 2023-10-08 ENCOUNTER — Encounter: Payer: Self-pay | Admitting: Surgery

## 2023-10-08 ENCOUNTER — Encounter
Admission: EM | Disposition: A | Discharge status: Home / Self Care | Payer: Self-pay | Source: Home / Self Care | Attending: Emergency Medicine

## 2023-10-08 DIAGNOSIS — K811 Chronic cholecystitis: Secondary | ICD-10-CM | POA: Diagnosis not present

## 2023-10-08 DIAGNOSIS — K8010 Calculus of gallbladder with chronic cholecystitis without obstruction: Secondary | ICD-10-CM | POA: Diagnosis not present

## 2023-10-08 LAB — CBC
HCT: 40.4 % (ref 36.0–46.0)
Hemoglobin: 13.7 g/dL (ref 12.0–15.0)
MCH: 32.1 pg (ref 26.0–34.0)
MCHC: 33.9 g/dL (ref 30.0–36.0)
MCV: 94.6 fL (ref 80.0–100.0)
Platelets: 126 10*3/uL — ABNORMAL LOW (ref 150–400)
RBC: 4.27 MIL/uL (ref 3.87–5.11)
RDW: 14.2 % (ref 11.5–15.5)
WBC: 2.3 10*3/uL — ABNORMAL LOW (ref 4.0–10.5)
nRBC: 0 % (ref 0.0–0.2)

## 2023-10-08 LAB — BASIC METABOLIC PANEL
Anion gap: 9 (ref 5–15)
BUN: 21 mg/dL (ref 8–23)
CO2: 24 mmol/L (ref 22–32)
Calcium: 9 mg/dL (ref 8.9–10.3)
Chloride: 104 mmol/L (ref 98–111)
Creatinine, Ser: 0.68 mg/dL (ref 0.44–1.00)
GFR, Estimated: >60 mL/min (ref >60)
Glucose, Bld: 88 mg/dL (ref 70–99)
Potassium: 3.7 mmol/L (ref 3.5–5.1)
Sodium: 137 mmol/L (ref 135–145)

## 2023-10-08 LAB — HEPATIC FUNCTION PANEL
ALT: 32 U/L (ref 0–44)
AST: 23 U/L (ref 15–41)
Albumin: 3.9 g/dL (ref 3.5–5.0)
Alkaline Phosphatase: 62 U/L (ref 38–126)
Bilirubin, Direct: 0.1 mg/dL (ref 0.0–0.2)
Indirect Bilirubin: 0.7 mg/dL (ref 0.3–0.9)
Total Bilirubin: 0.8 mg/dL (ref <1.2)
Total Protein: 7.1 g/dL (ref 6.5–8.1)

## 2023-10-08 LAB — HIV ANTIBODY (ROUTINE TESTING W REFLEX): HIV Screen 4th Generation wRfx: NONREACTIVE

## 2023-10-08 SURGERY — CHOLECYSTECTOMY, ROBOT-ASSISTED, LAPAROSCOPIC
Anesthesia: General | Site: Abdomen

## 2023-10-08 MED ORDER — DEXAMETHASONE SODIUM PHOSPHATE 10 MG/ML IJ SOLN
INTRAMUSCULAR | Status: DC | PRN
  Administered 2023-10-08: 10 mg via INTRAVENOUS

## 2023-10-08 MED ORDER — SODIUM CHLORIDE 0.9 % IV SOLN: INTRAVENOUS | Status: DC | PRN

## 2023-10-08 MED ORDER — ONDANSETRON HCL 4 MG/2ML IJ SOLN
INTRAMUSCULAR | Status: DC | PRN
  Administered 2023-10-08: 4 mg via INTRAVENOUS

## 2023-10-08 MED ORDER — ROCURONIUM BROMIDE 100 MG/10ML IV SOLN
INTRAVENOUS | Status: DC | PRN
  Administered 2023-10-08: 60 mg via INTRAVENOUS

## 2023-10-08 MED ORDER — BUPIVACAINE-EPINEPHRINE (PF) 0.25% -1:200000 IJ SOLN
INTRAMUSCULAR | Status: AC
  Filled 2023-10-08: qty 30

## 2023-10-08 MED ORDER — ACETAMINOPHEN 10 MG/ML IV SOLN: 1000.0000 mg | Freq: Once | INTRAVENOUS | Status: DC | PRN

## 2023-10-08 MED ORDER — HYDROMORPHONE HCL 1 MG/ML IJ SOLN
0.5000 mg | INTRAMUSCULAR | Status: DC | PRN
  Administered 2023-10-08: 0.5 mg via INTRAVENOUS
  Filled 2023-10-08: qty 0.5

## 2023-10-08 MED ORDER — DOCUSATE SODIUM 100 MG PO CAPS: 100.0000 mg | ORAL_CAPSULE | Freq: Two times a day (BID) | ORAL | Status: DC | PRN

## 2023-10-08 MED ORDER — PHENYLEPHRINE 80 MCG/ML (10ML) SYRINGE FOR IV PUSH (FOR BLOOD PRESSURE SUPPORT)
PREFILLED_SYRINGE | INTRAVENOUS | Status: DC | PRN
  Administered 2023-10-08 (×3): 80 ug via INTRAVENOUS

## 2023-10-08 MED ORDER — EPHEDRINE 5 MG/ML INJ
INTRAVENOUS | Status: AC
  Filled 2023-10-08: qty 5

## 2023-10-08 MED ORDER — CEFAZOLIN SODIUM-DEXTROSE 2-3 GM-%(50ML) IV SOLR
INTRAVENOUS | Status: DC | PRN
  Administered 2023-10-08: 2 g via INTRAVENOUS

## 2023-10-08 MED ORDER — BUPIVACAINE-EPINEPHRINE 0.25% -1:200000 IJ SOLN
INTRAMUSCULAR | Status: DC | PRN
  Administered 2023-10-08: 13 mL

## 2023-10-08 MED ORDER — LIDOCAINE HCL (CARDIAC) PF 100 MG/5ML IV SOSY
PREFILLED_SYRINGE | INTRAVENOUS | Status: DC | PRN
  Administered 2023-10-08: 100 mg via INTRAVENOUS

## 2023-10-08 MED ORDER — INDOCYANINE GREEN 25 MG IV SOLR
1.2500 mg | Freq: Once | INTRAVENOUS | Status: AC
  Administered 2023-10-08: 1.25 mg via INTRAVENOUS
  Filled 2023-10-08: qty 0.5

## 2023-10-08 MED ORDER — HYDROCODONE-ACETAMINOPHEN 5-325 MG PO TABS
1.0000 | ORAL_TABLET | ORAL | Status: DC | PRN
Start: 2023-10-08 — End: 2023-10-09
  Administered 2023-10-09: 2 via ORAL
  Filled 2023-10-08: qty 1
  Filled 2023-10-08: qty 2

## 2023-10-08 MED ORDER — SUGAMMADEX SODIUM 200 MG/2ML IV SOLN
INTRAVENOUS | Status: DC | PRN
  Administered 2023-10-08: 160 mg via INTRAVENOUS

## 2023-10-08 MED ORDER — ONDANSETRON HCL 4 MG/2ML IJ SOLN
INTRAMUSCULAR | Status: AC
  Filled 2023-10-08: qty 2

## 2023-10-08 MED ORDER — VISTASEAL 10 ML SINGLE DOSE KIT
PACK | CUTANEOUS | Status: DC | PRN
  Administered 2023-10-08: 10 mL via TOPICAL

## 2023-10-08 MED ORDER — OXYCODONE HCL 5 MG/5ML PO SOLN: 5.0000 mg | Freq: Once | ORAL | Status: DC | PRN

## 2023-10-08 MED ORDER — PHENYLEPHRINE 80 MCG/ML (10ML) SYRINGE FOR IV PUSH (FOR BLOOD PRESSURE SUPPORT)
PREFILLED_SYRINGE | INTRAVENOUS | Status: AC
  Filled 2023-10-08: qty 10

## 2023-10-08 MED ORDER — FENTANYL CITRATE (PF) 100 MCG/2ML IJ SOLN: 25.0000 ug | INTRAMUSCULAR | Status: DC | PRN

## 2023-10-08 MED ORDER — ONDANSETRON 4 MG PO TBDP: 4.0000 mg | ORAL_TABLET | Freq: Four times a day (QID) | ORAL | Status: DC | PRN

## 2023-10-08 MED ORDER — FENTANYL CITRATE (PF) 100 MCG/2ML IJ SOLN
INTRAMUSCULAR | Status: DC | PRN
  Administered 2023-10-08 (×2): 50 ug via INTRAVENOUS

## 2023-10-08 MED ORDER — OXYCODONE HCL 5 MG PO TABS: 5.0000 mg | ORAL_TABLET | Freq: Once | ORAL | Status: DC | PRN

## 2023-10-08 MED ORDER — CEFAZOLIN SODIUM 1 G IJ SOLR
INTRAMUSCULAR | Status: AC
  Filled 2023-10-08: qty 20

## 2023-10-08 MED ORDER — EPHEDRINE SULFATE-NACL 50-0.9 MG/10ML-% IV SOSY
PREFILLED_SYRINGE | INTRAVENOUS | Status: DC | PRN
  Administered 2023-10-08: 5 mg via INTRAVENOUS

## 2023-10-08 MED ORDER — DEXAMETHASONE SODIUM PHOSPHATE 10 MG/ML IJ SOLN
INTRAMUSCULAR | Status: AC
  Filled 2023-10-08: qty 1

## 2023-10-08 MED ORDER — VISTASEAL 10 ML SINGLE DOSE KIT
PACK | CUTANEOUS | Status: AC
  Filled 2023-10-08: qty 10

## 2023-10-08 MED ORDER — ACETAMINOPHEN 325 MG PO TABS
650.0000 mg | ORAL_TABLET | Freq: Four times a day (QID) | ORAL | Status: DC | PRN
  Administered 2023-10-09: 650 mg via ORAL
  Filled 2023-10-08: qty 2

## 2023-10-08 MED ORDER — ROCURONIUM BROMIDE 10 MG/ML (PF) SYRINGE
PREFILLED_SYRINGE | INTRAVENOUS | Status: AC
  Filled 2023-10-08: qty 10

## 2023-10-08 MED ORDER — ONDANSETRON HCL 4 MG/2ML IJ SOLN: 4.0000 mg | Freq: Four times a day (QID) | INTRAMUSCULAR | Status: DC | PRN

## 2023-10-08 MED ORDER — SODIUM CHLORIDE (PF) 0.9 % IJ SOLN
INTRAMUSCULAR | Status: AC
  Filled 2023-10-08: qty 10

## 2023-10-08 MED ORDER — ROCURONIUM BROMIDE 10 MG/ML (PF) SYRINGE
PREFILLED_SYRINGE | INTRAVENOUS | Status: AC
Start: 2023-10-08 — End: ?
  Filled 2023-10-08: qty 10

## 2023-10-08 MED ORDER — KETOROLAC TROMETHAMINE 30 MG/ML IJ SOLN
INTRAMUSCULAR | Status: AC
  Filled 2023-10-08: qty 1

## 2023-10-08 MED ORDER — PROPOFOL 10 MG/ML IV BOLUS
INTRAVENOUS | Status: DC | PRN
  Administered 2023-10-08: 150 mg via INTRAVENOUS

## 2023-10-08 MED ORDER — MIDAZOLAM HCL 2 MG/2ML IJ SOLN
INTRAMUSCULAR | Status: DC | PRN
  Administered 2023-10-08: 2 mg via INTRAVENOUS

## 2023-10-08 MED ORDER — SODIUM CHLORIDE 0.9 % IV SOLN
2.0000 g | INTRAVENOUS | Status: DC
  Administered 2023-10-08 – 2023-10-09 (×2): 2 g via INTRAVENOUS
  Filled 2023-10-08 (×3): qty 20

## 2023-10-08 MED ORDER — FENTANYL CITRATE (PF) 100 MCG/2ML IJ SOLN
INTRAMUSCULAR | Status: AC
  Filled 2023-10-08: qty 2

## 2023-10-08 MED ORDER — MIDAZOLAM HCL 2 MG/2ML IJ SOLN
INTRAMUSCULAR | Status: AC
  Filled 2023-10-08: qty 2

## 2023-10-08 MED ORDER — VISTASEAL 4 ML SINGLE DOSE KIT
PACK | CUTANEOUS | Status: AC
  Filled 2023-10-08: qty 4

## 2023-10-08 MED ORDER — ONDANSETRON HCL 4 MG/2ML IJ SOLN
4.0000 mg | Freq: Once | INTRAMUSCULAR | Status: AC | PRN
  Administered 2023-10-08: 4 mg via INTRAVENOUS

## 2023-10-08 MED ORDER — ACETAMINOPHEN 10 MG/ML IV SOLN
INTRAVENOUS | Status: AC
  Filled 2023-10-08: qty 100

## 2023-10-08 MED ORDER — HEMOSTATIC AGENTS (NO CHARGE) OPTIME
TOPICAL | Status: DC | PRN
  Administered 2023-10-08: 1 via TOPICAL

## 2023-10-08 MED ORDER — GLYCOPYRROLATE 0.2 MG/ML IJ SOLN
INTRAMUSCULAR | Status: AC
  Filled 2023-10-08: qty 1

## 2023-10-08 MED ORDER — ACETAMINOPHEN 10 MG/ML IV SOLN
INTRAVENOUS | Status: DC | PRN
  Administered 2023-10-08: 1000 mg via INTRAVENOUS

## 2023-10-08 MED ORDER — TRAMADOL HCL 50 MG PO TABS
50.0000 mg | ORAL_TABLET | Freq: Four times a day (QID) | ORAL | Status: DC | PRN
Start: 2023-10-08 — End: 2023-10-09
  Administered 2023-10-08 – 2023-10-09 (×2): 50 mg via ORAL
  Filled 2023-10-08 (×2): qty 1

## 2023-10-08 MED ORDER — LIDOCAINE HCL (PF) 2 % IJ SOLN
INTRAMUSCULAR | Status: AC
  Filled 2023-10-08: qty 5

## 2023-10-08 MED ORDER — KETOROLAC TROMETHAMINE 30 MG/ML IJ SOLN
INTRAMUSCULAR | Status: DC | PRN
  Administered 2023-10-08: 30 mg via INTRAVENOUS

## 2023-10-08 MED ORDER — PROPOFOL 10 MG/ML IV BOLUS
INTRAVENOUS | Status: AC
Start: 2023-10-08 — End: ?
  Filled 2023-10-08: qty 20

## 2023-10-08 SURGICAL SUPPLY — 48 items
APPLICATOR VISTASEAL 35 (MISCELLANEOUS) IMPLANT
BAG PRESSURE INF REUSE 1000 (BAG) IMPLANT
CANNULA REDUCER 12-8 DVNC XI (CANNULA) ×2 IMPLANT
CATH REDDICK CHOLANGI 4FR 50CM (CATHETERS) IMPLANT
CAUTERY HOOK MNPLR 1.6 DVNC XI (INSTRUMENTS) ×2 IMPLANT
CLIP LIGATING HEM O LOK PURPLE (MISCELLANEOUS) IMPLANT
CLIP LIGATING HEMO O LOK GREEN (MISCELLANEOUS) ×2 IMPLANT
DERMABOND ADVANCED .7 DNX12 (GAUZE/BANDAGES/DRESSINGS) ×2 IMPLANT
DRAPE ARM DVNC X/XI (DISPOSABLE) ×8 IMPLANT
DRAPE C-ARM XRAY 36X54 (DRAPES) IMPLANT
DRAPE COLUMN DVNC XI (DISPOSABLE) ×2 IMPLANT
ELECT REM PT RETURN 9FT ADLT (ELECTROSURGICAL) ×2
ELECTRODE REM PT RTRN 9FT ADLT (ELECTROSURGICAL) ×2 IMPLANT
FORCEPS BPLR 8 MD DVNC XI (FORCEP) ×2 IMPLANT
FORCEPS BPLR R/ABLATION 8 DVNC (INSTRUMENTS) ×2 IMPLANT
FORCEPS PROGRASP DVNC XI (FORCEP) ×2 IMPLANT
GLOVE BIO SURGEON STRL SZ 6.5 (GLOVE) ×4 IMPLANT
GLOVE BIOGEL PI IND STRL 6.5 (GLOVE) ×4 IMPLANT
GOWN STRL REUS W/ TWL LRG LVL3 (GOWN DISPOSABLE) ×6 IMPLANT
GRASPER SUT TROCAR 14GX15 (MISCELLANEOUS) ×2 IMPLANT
IRRIGATOR SUCT 8 DISP DVNC XI (IRRIGATION / IRRIGATOR) IMPLANT
IV CATH ANGIO 12GX3 LT BLUE (NEEDLE) IMPLANT
IV NS 1000ML BAXH (IV SOLUTION) IMPLANT
KIT PINK PAD W/HEAD ARE REST (MISCELLANEOUS) ×2 IMPLANT
KIT PINK PAD W/HEAD ARM REST (MISCELLANEOUS) ×2 IMPLANT
LABEL OR SOLS (LABEL) ×2 IMPLANT
MANIFOLD NEPTUNE II (INSTRUMENTS) ×2 IMPLANT
NDL HYPO 22X1.5 SAFETY MO (MISCELLANEOUS) ×2 IMPLANT
NDL INSUFFLATION 14GA 120MM (NEEDLE) ×2 IMPLANT
NEEDLE HYPO 22X1.5 SAFETY MO (MISCELLANEOUS) ×2 IMPLANT
NEEDLE INSUFFLATION 14GA 120MM (NEEDLE) ×2 IMPLANT
NS IRRIG 500ML POUR BTL (IV SOLUTION) ×2 IMPLANT
OBTURATOR OPTICAL STND 8 DVNC (TROCAR) ×2
OBTURATOR OPTICALSTD 8 DVNC (TROCAR) ×2 IMPLANT
PACK LAP CHOLECYSTECTOMY (MISCELLANEOUS) ×2 IMPLANT
SEAL UNIV 5-12 XI (MISCELLANEOUS) ×8 IMPLANT
SET TUBE SMOKE EVAC HIGH FLOW (TUBING) ×2 IMPLANT
SOL ELECTROSURG ANTI STICK (MISCELLANEOUS) ×2
SOLUTION ELECTROSURG ANTI STCK (MISCELLANEOUS) ×2 IMPLANT
SPIKE FLUID TRANSFER (MISCELLANEOUS) ×4 IMPLANT
SPONGE T-LAP 4X18 ~~LOC~~+RFID (SPONGE) IMPLANT
SUT MNCRL 4-0 27XMFL (SUTURE) ×2
SUT VICRYL 0 UR6 27IN ABS (SUTURE) ×2 IMPLANT
SUTURE MNCRL 4-0 27XMF (SUTURE) ×2 IMPLANT
SYS BAG RETRIEVAL 10MM (BASKET) ×2
SYSTEM BAG RETRIEVAL 10MM (BASKET) ×2 IMPLANT
TRAP FLUID SMOKE EVACUATOR (MISCELLANEOUS) ×2 IMPLANT
WATER STERILE IRR 500ML POUR (IV SOLUTION) ×2 IMPLANT

## 2023-10-08 NOTE — Interval H&P Note (Signed)
History and Physical Interval Note:  10/08/2023 12:06 PM  Holly Carter  has presented today for surgery, with the diagnosis of chronic cholecystitis.  The various methods of treatment have been discussed with the patient and family. After consideration of risks, benefits and other options for treatment, the patient has consented to  Procedure(s): XI ROBOTIC ASSISTED LAPAROSCOPIC CHOLECYSTECTOMY (N/A) INDOCYANINE GREEN FLUORESCENCE IMAGING (ICG) (N/A) as a surgical intervention.  The patient's history has been reviewed, patient examined, no change in status, stable for surgery.  I have reviewed the patient's chart and labs.  Questions were answered to the patient's satisfaction.     Carolan Shiver

## 2023-10-08 NOTE — Anesthesia Procedure Notes (Signed)
Procedure Name: Intubation Date/Time: 10/08/2023 1:13 PM  Performed by: Morene Crocker, CRNAPre-anesthesia Checklist: Patient identified, Patient being monitored, Timeout performed, Emergency Drugs available and Suction available Patient Re-evaluated:Patient Re-evaluated prior to induction Oxygen Delivery Method: Circle system utilized Preoxygenation: Pre-oxygenation with 100% oxygen Induction Type: IV induction Ventilation: Mask ventilation without difficulty Laryngoscope Size: 3 and McGrath Grade View: Grade I Tube type: Oral Tube size: 7.0 mm Number of attempts: 1 Airway Equipment and Method: Stylet Placement Confirmation: ETT inserted through vocal cords under direct vision, positive ETCO2 and breath sounds checked- equal and bilateral Secured at: 21 cm Tube secured with: Tape Dental Injury: Teeth and Oropharynx as per pre-operative assessment  Comments: Smooth atraumatic intubation, no complications noted

## 2023-10-08 NOTE — H&P (Addendum)
Subjective:   CC: chronic cholecystitis  HPI:  Holly Carter is a 67 y.o. female who is consulted by [facility] for evaluation of above cc.    Chronic RUQ pain, scheduled for elective surgery in the next few days but presented to ED last night and then again this am for recurring pain.  Pain mostly on right side, but also reports suprapubic pain as well.    Past Medical History:  has a past medical history of Arthritis, CCC (chronic calculous cholecystitis), Depression, DVT, lower extremity (HCC), History of kidney stones, Hypertension, Pneumonia, and Pre-diabetes.  Past Surgical History:  has a past surgical history that includes Abdominal hysterectomy; lithotripsy with stent; and Appendectomy.  Family History: family history is not on file.  Social History:  reports that she has never smoked. She has never used smokeless tobacco. She reports current alcohol use. She reports that she does not use drugs.  Current Medications:  Prior to Admission medications   Medication Sig Start Date End Date Taking? Authorizing Provider  cephALEXin (KEFLEX) 500 MG capsule Take 1 capsule (500 mg total) by mouth 2 (two) times daily for 7 days. 10/06/23 10/13/23  Dionne Bucy, MD  Multiple Vitamin (MULTIVITAMIN) tablet Take 1 tablet by mouth daily.    [provider]  ondansetron (ZOFRAN-ODT) 4 MG disintegrating tablet Take 1 tablet (4 mg total) by mouth every 8 (eight) hours as needed. 10/06/23   Dionne Bucy, MD  sertraline (ZOLOFT) 50 MG tablet Take 50 mg by mouth daily.    [provider]  traMADol (ULTRAM) 50 MG tablet Take 1 tablet (50 mg total) by mouth every 6 (six) hours as needed. 10/06/23 10/05/24  Dionne Bucy, MD    Allergies:  Allergies as of 10/07/2023   (No Known Allergies)       Objective:     BP 138/61 (BP Location: Right Arm)   Pulse 62   Temp 98.4 F (36.9 C) (Oral)   Resp 20   Ht 5\' 1"  (1.549 m)   Wt 75.8 kg   SpO2 96%   BMI  31.55 kg/m    Constitutional :  alert, cooperative, appears stated age, and no distress  Lymphatics/Throat:  no asymmetry, masses, or scars  Respiratory:  clear to auscultation bilaterally  Cardiovascular:  regular rate and rhythm  Gastrointestinal: Soft, some voluntary guarding with palpation, RUQ. Some TTP in suparpubic area as well .   Musculoskeletal: Steady movement  Skin: Cool and moist  Psychiatric: Normal affect, non-agitated, not confused       LABS:     Latest Ref Rng & Units 10/07/2023   10:47 AM 10/06/2023    9:43 AM 12/08/2017    8:13 PM  CMP  Glucose 70 - 99 mg/dL 295  92  621   BUN 8 - 23 mg/dL 24  23  14    Creatinine 0.44 - 1.00 mg/dL 3.08  6.57  8.46   Sodium 135 - 145 mmol/L 135  136  139   Potassium 3.5 - 5.1 mmol/L 3.8  3.9  3.7   Chloride 98 - 111 mmol/L 103  104  104   CO2 22 - 32 mmol/L 24  24  27    Calcium 8.9 - 10.3 mg/dL 9.2  9.0  9.2   Total Protein 6.5 - 8.1 g/dL 7.1  6.9  7.7   Total Bilirubin <1.2 mg/dL 0.4  0.8  0.6   Alkaline Phos 38 - 126 U/L 69  77  134   AST  15 - 41 U/L 26  28  51   ALT 0 - 44 U/L 34  38  46       Latest Ref Rng & Units 10/07/2023   10:47 AM 10/06/2023    9:43 AM 12/08/2017    8:13 PM  CBC  WBC 4.0 - 10.5 K/uL 2.8  2.6  3.1   Hemoglobin 12.0 - 15.0 g/dL 11.9  14.7  82.9   Hematocrit 36.0 - 46.0 % 41.5  41.3  40.1   Platelets 150 - 400 K/uL 127  121  170      RADS: CLINICAL DATA:  Abdominal pain none.  History of gallstones.   EXAM: CT ABDOMEN AND PELVIS WITH CONTRAST   TECHNIQUE: Multidetector CT imaging of the abdomen and pelvis was performed using the standard protocol following bolus administration of intravenous contrast.   RADIATION DOSE REDUCTION: This exam was performed according to the departmental dose-optimization program which includes automated exposure control, adjustment of the mA and/or kV according to patient size and/or use of iterative reconstruction technique.   CONTRAST:  OMNIPAQUE  IOHEXOL 300 MG/ML  SOLN   COMPARISON:  CT abdomen pelvis dated 07/18/2017.   FINDINGS: Lower chest: The visualized lung bases are clear.   No intra-abdominal free air or free fluid.   Hepatobiliary: Subcentimeter hypodense focus in the left lobe of the liver is too small to characterize. The liver is otherwise unremarkable. No biliary dilatation. Layering small gallstones. No pericholecystic fluid or evidence of acute cholecystitis by CT.   Pancreas: Unremarkable. No pancreatic ductal dilatation or surrounding inflammatory changes.   Spleen: Normal in size without focal abnormality.   Adrenals/Urinary Tract: The adrenal glands unremarkable. The kidneys, visualized ureters, and urinary bladder appear unremarkable.   Stomach/Bowel: There is sigmoid diverticulosis and scattered colonic diverticula without active inflammatory changes. There is no bowel obstruction or active inflammation. Appendectomy.   Vascular/Lymphatic: Mild aortoiliac atherosclerotic disease. The the IVC is unremarkable. No portal venous gas. There is no adenopathy.   Reproductive: Hysterectomy.  No adnexal masses.   Other: None   Musculoskeletal: No acute or significant osseous findings.   IMPRESSION: 1. No acute intra-abdominal or pelvic pathology. 2. Cholelithiasis. 3. Colonic diverticulosis. No bowel obstruction. 4.  Aortic Atherosclerosis (ICD10-I70.0).     Electronically Signed   By: Elgie Collard M.D.   On: 10/06/2023 15:33  CLINICAL DATA:  Upper abdominal pain   EXAM: ULTRASOUND ABDOMEN LIMITED RIGHT UPPER QUADRANT   COMPARISON:  CT abdomen pelvis 07/18/2017   FINDINGS: Gallbladder:   Cholelithiasis. No gallbladder wall thickening or pericholecystic fluid. Negative sonographic Murphy's sign.   Common bile duct:   Diameter: 5.3 mm   Liver:   Heterogeneous increased hepatic parenchymal echogenicity. There is a 9 x 9 x 6 mm echogenic lesion in the right hepatic lobe. Portal  vein is patent on color Doppler imaging with normal direction of blood flow towards the liver.   Other: None.   IMPRESSION: 1. Cholelithiasis without secondary signs of acute cholecystitis. 2. Heterogeneous increased hepatic parenchymal echogenicity suggestive of steatosis. 3. There is a 9 mm echogenic lesion in the right hepatic lobe. This is nonspecific but may represent a hemangioma. Recommend follow-up ultrasound in 6 months to assess for stability.     Electronically Signed   By: Annia Belt M.D.   On: 10/06/2023 13:27   Assessment:      Chronic cholecystitis- suprapubic pain is somewhat atypical, but CT shows no other obvious pathology, and pt states  pain is more on right side.  Recommended proceeding with robotic lap chole as previously discussed in clinic.  Plan:      Discussed the risk of surgery again including post-op infxn, seroma, biloma, chronic pain, poor-delayed wound healing, retained gallstone, conversion to open procedure, post-op SBO or ileus, and need for additional procedures to address said risks.  The risks of general anesthetic including MI, CVA, sudden death or even reaction to anesthetic medications also discussed. Alternatives include continued observation.  Benefits include possible symptom relief, prevention of complications including acute cholecystitis, pancreatitis.  Typical post operative recovery of 3-5 days rest, continued pain in area and incision sites, possible loose stools up to 4-6 weeks, also discussed.  The patient understands the risks, any and all questions were answered to the patient's satisfaction.  Admit, pain control, IV abx in the meantime.  discussed with primary surgeon dr. Maia Plan and he will proceed with surgery.    labs/images/medications/previous chart entries reviewed personally and relevant changes/updates noted above.

## 2023-10-08 NOTE — Op Note (Signed)
Preoperative diagnosis: Chronic Cholecystitis  Postoperative diagnosis: Same  Procedure: Robotic Assisted Laparoscopic Cholecystectomy.   Anesthesia: GETA   Surgeon: Dr. Hazle Quant  Wound Classification: Clean Contaminated  Indications: Patient is a 67 y.o. female developed right upper quadrant pain and on workup was found to have cholelithiasis with a normal common duct and chronic. Robotic Assisted Laparoscopic cholecystectomy was elected.  Findings: Cirrhotic liver  Critical view of safety achieved Cystic duct and artery identified, ligated and divided Adequate hemostasis  Description of procedure: The patient was placed on the operating table in the supine position. General anesthesia was induced. A time-out was completed verifying correct patient, procedure, site, positioning, and implant(s) and/or special equipment prior to beginning this procedure. An orogastric tube was placed. The abdomen was prepped and draped in the usual sterile fashion.  An incision was made in a natural skin line below the umbilicus.  The fascia was elevated and the Veress needle inserted. Proper position was confirmed by aspiration and saline meniscus test.  The abdomen was insufflated with carbon dioxide to a pressure of 15 mmHg. The patient tolerated insufflation well. A 8-mm trocar was then inserted in optiview fashion.  The laparoscope was inserted and the abdomen inspected. No injuries from initial trocar placement were noted. Additional trocars were then inserted in the following locations: an 8-mm trocar in the left lateral abdomen, and another two 8-mm trocars to the right side of the abdomen 5 cm appart. The umbilical trocar was changed to a 12 mm trocar all under direct visualization. The abdomen was inspected and no abnormalities were found. The table was placed in the reverse Trendelenburg position with the right side up. The robotic arms were docked and target anatomy identified. Instrument  inserted under direct visualization.  Filmy adhesions between the gallbladder and omentum, duodenum and transverse colon were lysed with electrocautery. The dome of the gallbladder was grasped with a prograsp and retracted over the dome of the liver. The infundibulum was also grasped with an atraumatic grasper and retracted toward the right lower quadrant. This maneuver exposed Calot's triangle. The peritoneum overlying the gallbladder infundibulum was then incised and the cystic duct and cystic artery identified and circumferentially dissected. Critical view of safety reviewed before ligating any structure. Firefly images taken to visualize biliary ducts. The cystic duct and cystic artery were then doubly clipped and divided close to the gallbladder.  The gallbladder was then dissected from its peritoneal attachments by electrocautery. Hemostasis was checked and the gallbladder and contained stones were removed using an endoscopic retrieval bag. The gallbladder was passed off the table as a specimen. The gallbladder fossa was copiously irrigated with saline and hemostasis was obtained. There was no evidence of bleeding from the gallbladder fossa or cystic artery or leakage of the bile from the cystic duct stump. Secondary trocars were removed under direct vision. No bleeding was noted. The robotic arms were undoked. The scope was withdrawn and the umbilical trocar removed. The abdomen was allowed to collapse. The fascia of the 12mm trocar sites was closed with figure-of-eight 0 vicryl sutures. The skin was closed with subcuticular sutures of 4-0 monocryl and topical skin adhesive. The orogastric tube was removed.  The patient tolerated the procedure well and was taken to the postanesthesia care unit in stable condition.   Specimen: Gallbladder  Complications: None  EBL: 10 mL

## 2023-10-08 NOTE — Anesthesia Preprocedure Evaluation (Signed)
Anesthesia Evaluation  Patient identified by MRN, date of birth, ID band Patient awake  General Assessment Comment:  Mild nausea todtay  Reviewed: Allergy & Precautions, NPO status , Patient's Chart, lab work & pertinent test results  History of Anesthesia Complications Negative for: history of anesthetic complications  Airway Mallampati: II  TM Distance: >3 FB Neck ROM: Full    Dental no notable dental hx. (+) Teeth Intact   Pulmonary neg pulmonary ROS, neg sleep apnea, neg COPD, Patient abstained from smoking.Not current smoker   Pulmonary exam normal breath sounds clear to auscultation       Cardiovascular Exercise Tolerance: Good METShypertension, Pt. on medications (-) CAD and (-) Past MI (-) dysrhythmias  Rhythm:Regular Rate:Normal - Systolic murmurs    Neuro/Psych  PSYCHIATRIC DISORDERS  Depression    negative neurological ROS     GI/Hepatic ,neg GERD  ,,(+)     (-) substance abuse    Endo/Other  neg diabetes    Renal/GU negative Renal ROS     Musculoskeletal  (+) Arthritis ,    Abdominal  (+)  Abdomen: tender.   Peds  Hematology   Anesthesia Other Findings Past Medical History: No date: Arthritis No date: CCC (chronic calculous cholecystitis) No date: Depression No date: DVT, lower extremity (HCC)     Comment:  30 years ago in [place] after appendix was removed No date: History of kidney stones No date: Hypertension No date: Pneumonia No date: Pre-diabetes  Reproductive/Obstetrics                             Anesthesia Physical Anesthesia Plan  ASA: 2  Anesthesia Plan: General   Post-op Pain Management: Ofirmev IV (intra-op)*   Induction: Intravenous  PONV Risk Score and Plan: 4 or greater and Ondansetron, Dexamethasone, Treatment may vary due to age or medical condition and Midazolam  Airway Management Planned: Oral ETT and Video Laryngoscope  Planned  Additional Equipment: None  Intra-op Plan:   Post-operative Plan: Extubation in OR  Informed Consent: I have reviewed the patients History and Physical, chart, labs and discussed the procedure including the risks, benefits and alternatives for the proposed anesthesia with the patient or authorized representative who has indicated his/her understanding and acceptance.     Dental advisory given  Plan Discussed with: CRNA and Surgeon  Anesthesia Plan Comments: (Discussed risks of anesthesia with patient, including PONV, sore throat, lip/dental/eye damage. Rare risks discussed as well, such as cardiorespiratory and neurological sequelae, and allergic reactions. Discussed the role of CRNA in patient's perioperative care. Patient understands.)        Anesthesia Quick Evaluation

## 2023-10-08 NOTE — Plan of Care (Signed)

## 2023-10-08 NOTE — ED Notes (Signed)
ED TO INPATIENT HANDOFF REPORT  ED Nurse Name and Phone #: Ladona Ridgel & Tracina Beaumont [phone]  S Name/Age/Gender Holly Carter 67 y.o. female Room/Bed: ED32A/ED32A  Code Status   Code Status: Full Code  Home/SNF/Other Home Patient oriented to: self, place, time, and situation Is this baseline? Yes   Triage Complete: Triage complete  Chief Complaint Chronic cholecystitis [K81.1]  Triage Note Right sided abdominal pain. Seen through ED yesterday for the same.  Patient c/o worsening pain and vomiting today.    Has surgery scheduled for 12/16.   Allergies No Known Allergies  Level of Care/Admitting Diagnosis ED Disposition     ED Disposition  Admit   Condition  --   Comment  Hospital Area: [facility] [[id number]]  Level of Care: Med-Surg [16]  Covid Evaluation: Asymptomatic - no recent exposure (last 10 days) testing not required  Diagnosis: Chronic cholecystitis [575.11.ICD-9-CM]  Admitting Physician: Sung Amabile [[id number]]  Attending Physician: Sung Amabile [[id number]]  Certification:: I certify this patient will need inpatient services for at least 2 midnights  Expected Medical Readiness: 10/09/2023          B Medical/Surgery History Past Medical History:  Diagnosis Date   Arthritis    CCC (chronic calculous cholecystitis)    Depression    DVT, lower extremity (HCC)    30 years ago in [place] after appendix was removed   History of kidney stones    Hypertension    Pneumonia    Pre-diabetes    Past Surgical History:  Procedure Laterality Date   ABDOMINAL HYSTERECTOMY     APPENDECTOMY     in [place]   lithotripsy with stent       A IV Location/Drains/Wounds Patient Lines/Drains/Airways Status     Active Line/Drains/Airways     Name Placement date Placement time Site Days   Peripheral IV 10/07/23 22 G Posterior;Right Hand 10/07/23  1214  Hand  1            Intake/Output Last 24 hours  Intake/Output Summary (Last 24 hours)  at 10/08/2023 0500 Last data filed at 10/07/2023 1336 Gross per 24 hour  Intake 50 ml  Output --  Net 50 ml    Labs/Imaging Results for orders placed or performed during the hospital encounter of 10/07/23 (from the past 48 hour(s))  Lipase, blood     Status: None   Collection Time: 10/07/23 10:47 AM  Result Value Ref Range   Lipase 31 11 - 51 U/L    Comment: Performed at [facility], [address] [place], [place] [address]  Comprehensive metabolic panel     Status: Abnormal   Collection Time: 10/07/23 10:47 AM  Result Value Ref Range   Sodium 135 135 - 145 mmol/L   Potassium 3.8 3.5 - 5.1 mmol/L   Chloride 103 98 - 111 mmol/L   CO2 24 22 - 32 mmol/L   Glucose, Bld 109 (H) 70 - 99 mg/dL    Comment: Glucose reference range applies only to samples taken after fasting for at least 8 hours.   BUN 24 (H) 8 - 23 mg/dL   Creatinine, Ser 6.21 0.44 - 1.00 mg/dL   Calcium 9.2 8.9 - 30.8 mg/dL   Total Protein 7.1 6.5 - 8.1 g/dL   Albumin 4.1 3.5 - 5.0 g/dL   AST 26 15 - 41 U/L   ALT 34 0 - 44 U/L   Alkaline Phosphatase 69 38 - 126 U/L   Total Bilirubin 0.4 <1.2  mg/dL   GFR, Estimated >82 >95 mL/min    Comment: (NOTE) Calculated using the CKD-EPI Creatinine Equation (2021)    Anion gap 8 5 - 15    Comment: Performed at [facility], [address] [place], [place] [address]  CBC     Status: Abnormal   Collection Time: 10/07/23 10:47 AM  Result Value Ref Range   WBC 2.8 (L) 4.0 - 10.5 K/uL   RBC 4.34 3.87 - 5.11 MIL/uL   Hemoglobin 14.1 12.0 - 15.0 g/dL   HCT 86.5 78.4 - 69.6 %   MCV 95.6 80.0 - 100.0 fL   MCH 32.5 26.0 - 34.0 pg   MCHC 34.0 30.0 - 36.0 g/dL   RDW 29.5 28.4 - 13.2 %   Platelets 127 (L) 150 - 400 K/uL   nRBC 0.0 0.0 - 0.2 %    Comment: Performed at [facility], [address], [place], [place] [address]  Troponin I (High Sensitivity)     Status: None   Collection Time: 10/07/23 10:47 AM  Result Value Ref Range   Troponin I  (High Sensitivity) 3 <18 ng/L    Comment: (NOTE) Elevated high sensitivity troponin I (hsTnI) values and significant  changes across serial measurements may suggest ACS but many other  chronic and acute conditions are known to elevate hsTnI results.  Refer to the "Links" section for chest pain algorithms and additional  guidance. Performed at [facility], [address] [place], [place] [address]   Urinalysis, Routine w reflex microscopic -Urine, Clean Catch     Status: Abnormal   Collection Time: 10/07/23 12:22 PM  Result Value Ref Range   Color, Urine YELLOW (A) YELLOW   APPearance CLEAR (A) CLEAR   Specific Gravity, Urine 1.011 1.005 - 1.030   pH 7.0 5.0 - 8.0   Glucose, UA NEGATIVE NEGATIVE mg/dL   Hgb urine dipstick SMALL (A) NEGATIVE   Bilirubin Urine NEGATIVE NEGATIVE   Ketones, ur NEGATIVE NEGATIVE mg/dL   Protein, ur NEGATIVE NEGATIVE mg/dL   Nitrite NEGATIVE NEGATIVE   Leukocytes,Ua NEGATIVE NEGATIVE   RBC / HPF 0-5 0 - 5 RBC/hpf   WBC, UA 0-5 0 - 5 WBC/hpf   Bacteria, UA MANY (A) NONE SEEN   Squamous Epithelial / HPF 0-5 0 - 5 /HPF   Mucus PRESENT     Comment: Performed at [facility], [address], [place], [place] [address]   CT ABDOMEN PELVIS W CONTRAST  Result Date: 10/06/2023 CLINICAL DATA:  Abdominal pain none.  History of gallstones. EXAM: CT ABDOMEN AND PELVIS WITH CONTRAST TECHNIQUE: Multidetector CT imaging of the abdomen and pelvis was performed using the standard protocol following bolus administration of intravenous contrast. RADIATION DOSE REDUCTION: This exam was performed according to the departmental dose-optimization program which includes automated exposure control, adjustment of the mA and/or kV according to patient size and/or use of iterative reconstruction technique. CONTRAST:  OMNIPAQUE IOHEXOL 300 MG/ML  SOLN COMPARISON:  CT abdomen pelvis dated 07/18/2017. FINDINGS: Lower chest: The visualized lung bases are clear.  No intra-abdominal free air or free fluid. Hepatobiliary: Subcentimeter hypodense focus in the left lobe of the liver is too small to characterize. The liver is otherwise unremarkable. No biliary dilatation. Layering small gallstones. No pericholecystic fluid or evidence of acute cholecystitis by CT. Pancreas: Unremarkable. No pancreatic ductal dilatation or surrounding inflammatory changes. Spleen: Normal in size without focal abnormality. Adrenals/Urinary Tract: The adrenal glands unremarkable. The kidneys, visualized ureters, and  urinary bladder appear unremarkable. Stomach/Bowel: There is sigmoid diverticulosis and scattered colonic diverticula without active inflammatory changes. There is no bowel obstruction or active inflammation. Appendectomy. Vascular/Lymphatic: Mild aortoiliac atherosclerotic disease. The the IVC is unremarkable. No portal venous gas. There is no adenopathy. Reproductive: Hysterectomy.  No adnexal masses. Other: None Musculoskeletal: No acute or significant osseous findings. IMPRESSION: 1. No acute intra-abdominal or pelvic pathology. 2. Cholelithiasis. 3. Colonic diverticulosis. No bowel obstruction. 4.  Aortic Atherosclerosis (ICD10-I70.0). Electronically Signed   By: Elgie Collard M.D.   On: 10/06/2023 15:33   US Abdomen Limited RUQ (LIVER/GB)  Result Date: 10/06/2023 CLINICAL DATA:  Upper abdominal pain EXAM: ULTRASOUND ABDOMEN LIMITED RIGHT UPPER QUADRANT COMPARISON:  CT abdomen pelvis 07/18/2017 FINDINGS: Gallbladder: Cholelithiasis. No gallbladder wall thickening or pericholecystic fluid. Negative sonographic Murphy's sign. Common bile duct: Diameter: 5.3 mm Liver: Heterogeneous increased hepatic parenchymal echogenicity. There is a 9 x 9 x 6 mm echogenic lesion in the right hepatic lobe. Portal vein is patent on color Doppler imaging with normal direction of blood flow towards the liver. Other: None. IMPRESSION: 1. Cholelithiasis without secondary signs of acute  cholecystitis. 2. Heterogeneous increased hepatic parenchymal echogenicity suggestive of steatosis. 3. There is a 9 mm echogenic lesion in the right hepatic lobe. This is nonspecific but may represent a hemangioma. Recommend follow-up ultrasound in 6 months to assess for stability. Electronically Signed   By: Annia Belt M.D.   On: 10/06/2023 13:27    Pending Labs Unresulted Labs (From admission, onward)     Start     Ordered   Signed and Held  HIV Antibody (routine testing w rflx)  (HIV Antibody (Routine testing w reflex) panel)  Once,   R        Signed and Held            Vitals/Pain Today's Vitals   10/07/23 1530 10/07/23 1554 10/07/23 2330 10/07/23 2330  BP:  (!) 121/57  (!) 125/59  Pulse: 61   73  Resp: 20   20  Temp:   98.4 F (36.9 C)   TempSrc:   Oral   SpO2: 95%   99%  Weight:      Height:      PainSc:        Isolation Precautions No active isolations  Medications Medications  fentaNYL (SUBLIMAZE) injection 50 mcg (50 mcg Intravenous Given 10/07/23 1222)  ondansetron (ZOFRAN) injection 4 mg (4 mg Intravenous Given 10/07/23 1222)  piperacillin-tazobactam (ZOSYN) IVPB 3.375 g (0 g Intravenous Stopped 10/07/23 1336)    Mobility walks     Focused Assessments Cardiac Assessment Handoff:  Cardiac Rhythm: Normal sinus rhythm Lab Results  Component Value Date   TROPONINI <0.03 12/08/2017   No results found for: "DDIMER" Does the Patient currently have chest pain? No   , Neuro Assessment Handoff:  Swallow screen pass?  Not ordered Cardiac Rhythm: Normal sinus rhythm       Neuro Assessment: Within Defined Limits Neuro Checks:      Has TPA been given? No If patient is a Neuro Trauma and patient is going to OR before floor call report to 4N Charge nurse: [phone] or [phone]   R Recommendations: See Admitting Provider Note  Report given to:   Additional Notes: .NPO

## 2023-10-08 NOTE — Transfer of Care (Signed)
Immediate Anesthesia Transfer of Care Note  Patient: Holly Carter  Procedure(s) Performed: XI ROBOTIC ASSISTED LAPAROSCOPIC CHOLECYSTECTOMY (Abdomen) INDOCYANINE GREEN FLUORESCENCE IMAGING (ICG)  Patient Location: PACU  Anesthesia Type:General  Level of Consciousness: drowsy  Airway & Oxygen Therapy: Patient Spontanous Breathing and Patient connected to face mask oxygen  Post-op Assessment: Report given to RN and Post -op Vital signs reviewed and stable  Post vital signs: Reviewed and stable  Last Vitals:  Vitals Value Taken Time  BP 123/75 1424  Temp 35.9 1424  Pulse 78 1424  Resp 16 1424  SpO2 98 1424    Last Pain:  Vitals:   10/08/23 1159  TempSrc: Temporal  PainSc: 0-No pain      Patients Stated Pain Goal: 2 (10/07/23 1042)  Complications: No notable events documented.

## 2023-10-09 DIAGNOSIS — K811 Chronic cholecystitis: Secondary | ICD-10-CM | POA: Diagnosis not present

## 2023-10-09 LAB — SURGICAL PATHOLOGY

## 2023-10-09 MED ORDER — MELOXICAM 15 MG PO TABS: 15.0000 mg | ORAL_TABLET | Freq: Every day | ORAL | 0 refills | Status: DC

## 2023-10-09 NOTE — Discharge Summary (Signed)
  Patient ID: Cambryn Dock MRN: [id number] DOB/AGE: [birth year] 67 y.o.  Admit date: 10/07/2023 Discharge date: 10/09/2023   Discharge Diagnoses:  Principal Problem:   Chronic cholecystitis   Procedures: Robotic assisted laparoscopic cholecystectomy  Hospital Course: Patient admitted with recurrent chronic cholecystitis.  Unable to control pain and multiple ED visits it was decided to admit patient and treat with cholecystectomy.  Patient underwent robotic cholecystectomy.  Patient tolerated the procedure well.  The patient is sitting down in the chair this morning with pain control.  She is tolerating diet.  No sign of complications.  Physical Exam Vitals reviewed.  HENT:     Head: Normocephalic.  Cardiovascular:     Rate and Rhythm: Normal rate and regular rhythm.  Pulmonary:     Effort: Pulmonary effort is normal.     Breath sounds: Normal breath sounds.  Abdominal:     General: Abdomen is flat. Bowel sounds are normal. There is no distension.     Palpations: Abdomen is soft.     Tenderness: There is no abdominal tenderness.  Skin:    General: Skin is warm.  Neurological:     Mental Status: She is alert.      Consults: None  Disposition: Discharge disposition: 01-Home or Self Care       Discharge Instructions     Diet - low sodium heart healthy   Complete by: As directed    Increase activity slowly   Complete by: As directed       Allergies as of 10/09/2023   No Known Allergies      Medication List     TAKE these medications    cephALEXin 500 MG capsule Commonly known as: KEFLEX Take 1 capsule (500 mg total) by mouth 2 (two) times daily for 7 days.   diclofenac Sodium 1 % Gel Commonly known as: VOLTAREN Apply 2 g topically 4 (four) times daily.   multivitamin tablet Take 1 tablet by mouth daily.   ondansetron 4 MG disintegrating tablet Commonly known as: ZOFRAN-ODT Take 1 tablet (4 mg total) by mouth every 8 (eight) hours as  needed.   sertraline 50 MG tablet Commonly known as: ZOLOFT Take 50 mg by mouth daily.   traMADol 50 MG tablet Commonly known as: Ultram Take 1 tablet (50 mg total) by mouth every 6 (six) hours as needed.        Follow-up Information     Carolan Shiver, MD Follow up in 2 week(s).   Specialty: General Surgery Why: follow up after cholecystectomy Contact information: [address] [place] [address] [phone]

## 2023-10-09 NOTE — Care Management Obs Status (Signed)
MEDICARE OBSERVATION STATUS NOTIFICATION   Patient Details  Name: Holly Carter MRN: [id number] Date of Birth: Aug 30, 1956   Medicare Observation Status Notification Given:  Yes    Chapman Fitch, RN 10/09/2023, 10:12 AM

## 2023-10-09 NOTE — Discharge Instructions (Addendum)
  Diet: Resume home heart healthy regular diet.   Activity: No heavy lifting >20 pounds (children, pets, laundry, garbage) or strenuous activity until follow-up, but light activity and walking are encouraged. Do not drive or drink alcohol if taking narcotic pain medications.  Wound care: May shower with soapy water and pat dry (do not rub incisions), but no baths or submerging incision underwater until follow-up. (no swimming)   Medications: Resume all home medications. For mild to moderate pain: acetaminophen (Tylenol) or ibuprofen (if no kidney disease). Combining Tylenol with alcohol can substantially increase your risk of causing liver disease. Narcotic pain medications, if prescribed, can be used for severe pain, though may cause nausea, constipation, and drowsiness.   May use the Tramadol prescribed from the ED for breakthrough pain.   Call office [phone]) at any time if any questions, worsening pain, fevers/chills, bleeding, drainage from incision site, or other concerns.   Dieta: Reanude la dieta habitual casera y saludable para el corazn.   Actividad: No levantar objetos pesados de ms de 20 libras (nios, mascotas, lavandera, basura) ni realizar actividades extenuantes [employer], pero se recomienda Education officer, environmental actividades ligeras y Advertising account planner. No conduzca ni beba alcohol si toma analgsicos narcticos.  Cuidado de la herida: Puede ducharse con agua y jabn y secarse con palmaditas (no frotar las incisiones), pero no baarse ni sumergir la incisin bajo el agua Artist. (no nadar)   Medicamentos: Reanudar todos los medicamentos caseros. Para dolor leve a moderado: acetaminofn (Tylenol) o ibuprofeno (si no hay enfermedad renal). La combinacin de Tylenol con alcohol puede aumentar sustancialmente el riesgo de causar enfermedad heptica. Los analgsicos narcticos, si se recetan, se pueden usar para el dolor intenso, aunque pueden causar nuseas, estreimiento y  somnolencia.   Puede usar el Tramadol recetado en el servicio de urgencias para el dolor irruptivo.   Llame a la oficina [phone]) en cualquier momento si tiene alguna pregunta, empeora el dolor, tiene [place] o escalofros, [place], supura del sitio de la incisin u otras inquietudes.

## 2023-10-09 NOTE — Anesthesia Postprocedure Evaluation (Signed)
Anesthesia Post Note  Patient: Holly Carter  Procedure(s) Performed: XI ROBOTIC ASSISTED LAPAROSCOPIC CHOLECYSTECTOMY (Abdomen) INDOCYANINE GREEN FLUORESCENCE IMAGING (ICG)  Patient location during evaluation: PACU Anesthesia Type: General Level of consciousness: awake and alert Pain management: pain level controlled Vital Signs Assessment: post-procedure vital signs reviewed and stable Respiratory status: spontaneous breathing, nonlabored ventilation, respiratory function stable and patient connected to nasal cannula oxygen Cardiovascular status: blood pressure returned to baseline and stable Postop Assessment: no apparent nausea or vomiting Anesthetic complications: no   No notable events documented.   Last Vitals:  Vitals:   10/08/23 1948 10/09/23 0237  BP: 118/75 133/87  Pulse: (!) 101 (!) 102  Resp: 20 18  Temp: 36.4 C 36.7 C  SpO2: 96% 97%    Last Pain:  Vitals:   10/09/23 0749  TempSrc:   PainSc: 10-Worst pain ever                 Cleda Mccreedy Krysten Veronica

## 2023-10-09 NOTE — TOC CM/SW Note (Signed)
Transition of Care [facility]) - Inpatient Brief Assessment   Patient Details  Name: Dulcie Trubey MRN: [id number] Date of Birth: 09/27/1956  Transition of Care [facility]) CM/SW Contact:    Chapman Fitch, RN Phone Number: 10/09/2023, 10:37 AM   Clinical Narrative:   Transition of Care [facility]) Screening Note   Patient Details  Name: Misty Roig Date of Birth: 1956-04-25   Transition of Care [facility]) CM/SW Contact:    Chapman Fitch, RN Phone Number: 10/09/2023, 10:37 AM    Transition of Care Department [facility]) has reviewed patient and no TOC needs have been identified at this time. If new patient transition needs arise, please place a TOC consult.    Transition of Care Asessment: Insurance and Status: Insurance coverage has been reviewed       Prior/Current Home Services: No current home services Social Determinants of Health Reivew: SDOH reviewed no interventions necessary Readmission risk has been reviewed: Yes Transition of care needs: no transition of care needs at this time

## 2023-10-09 NOTE — Care Management CC44 (Addendum)
Condition Code 44 Documentation Completed  Patient Details  Name: Holly Carter MRN: [id number] Date of Birth: 1956-02-12  Met with patient at bedside and reviewed with stratus interpreter   Condition Code 44 given:  Yes Patient signature on Condition Code 44 notice:  Yes Documentation of 2 MD's agreement:  Yes Code 44 added to claim:  Yes    Chapman Fitch, RN 10/09/2023, 10:12 AM

## 2023-10-09 NOTE — Plan of Care (Signed)
  Problem: Education: Goal: Knowledge of General Education information will improve Description: Including pain rating scale, medication(s)/side effects and non-pharmacologic comfort measures Outcome: Adequate for Discharge   Problem: Health Behavior/Discharge Planning: Goal: Ability to manage health-related needs will improve Outcome: Adequate for Discharge   Problem: Clinical Measurements: Goal: Ability to maintain clinical measurements within normal limits will improve Outcome: Adequate for Discharge Goal: Will remain free from infection Outcome: Adequate for Discharge Goal: Diagnostic test results will improve Outcome: Adequate for Discharge Goal: Respiratory complications will improve Outcome: Adequate for Discharge Goal: Cardiovascular complication will be avoided Outcome: Adequate for Discharge   Problem: Activity: Goal: Risk for activity intolerance will decrease Outcome: Adequate for Discharge   Problem: Nutrition: Goal: Adequate nutrition will be maintained Outcome: Adequate for Discharge   Problem: Coping: Goal: Level of anxiety will decrease Outcome: Adequate for Discharge   Problem: Elimination: Goal: Will not experience complications related to bowel motility Outcome: Adequate for Discharge Goal: Will not experience complications related to urinary retention Outcome: Adequate for Discharge   Problem: Pain Management: Goal: General experience of comfort will improve Outcome: Adequate for Discharge   Problem: Safety: Goal: Ability to remain free from injury will improve Outcome: Adequate for Discharge   Problem: Skin Integrity: Goal: Risk for impaired skin integrity will decrease Outcome: Adequate for Discharge    Pt aox4, respirations even and unlabored on RA. Pt has all belongings. Pt has received all DC instructions. Pt being taken home by family. Pt taken down to lobby with staff via wheelchair

## 2023-10-21 DIAGNOSIS — M75101 Unspecified rotator cuff tear or rupture of right shoulder, not specified as traumatic: Secondary | ICD-10-CM | POA: Diagnosis not present

## 2023-10-21 DIAGNOSIS — M75102 Unspecified rotator cuff tear or rupture of left shoulder, not specified as traumatic: Secondary | ICD-10-CM | POA: Diagnosis not present

## 2023-10-29 ENCOUNTER — Other Ambulatory Visit: Payer: Self-pay | Admitting: Family

## 2023-10-29 ENCOUNTER — Ambulatory Visit: Payer: 59 | Admitting: Family

## 2023-10-29 ENCOUNTER — Encounter: Payer: Self-pay | Admitting: Family

## 2023-10-29 VITALS — BP 124/68 | HR 78 | Temp 98.1°F | Ht 58.5 in | Wt 160.2 lb

## 2023-10-29 DIAGNOSIS — F32A Depression, unspecified: Secondary | ICD-10-CM | POA: Diagnosis not present

## 2023-10-29 DIAGNOSIS — F419 Anxiety disorder, unspecified: Secondary | ICD-10-CM | POA: Diagnosis not present

## 2023-10-29 DIAGNOSIS — R5383 Other fatigue: Secondary | ICD-10-CM | POA: Diagnosis not present

## 2023-10-29 DIAGNOSIS — D72828 Other elevated white blood cell count: Secondary | ICD-10-CM

## 2023-10-29 DIAGNOSIS — Z13220 Encounter for screening for lipoid disorders: Secondary | ICD-10-CM | POA: Diagnosis not present

## 2023-10-29 DIAGNOSIS — Z9049 Acquired absence of other specified parts of digestive tract: Secondary | ICD-10-CM | POA: Diagnosis not present

## 2023-10-29 DIAGNOSIS — D696 Thrombocytopenia, unspecified: Secondary | ICD-10-CM

## 2023-10-29 DIAGNOSIS — D72819 Decreased white blood cell count, unspecified: Secondary | ICD-10-CM | POA: Diagnosis not present

## 2023-10-29 LAB — CBC WITH DIFFERENTIAL/PLATELET
Basophils Absolute: 0 10*3/uL (ref 0.0–0.1)
Basophils Relative: 0.2 % (ref 0.0–3.0)
Eosinophils Absolute: 0 10*3/uL (ref 0.0–0.7)
Eosinophils Relative: 0.6 % (ref 0.0–5.0)
HCT: 41.3 % (ref 36.0–46.0)
Hemoglobin: 13.8 g/dL (ref 12.0–15.0)
Lymphocytes Relative: 10.2 % — ABNORMAL LOW (ref 12.0–46.0)
Lymphs Abs: 0.5 10*3/uL — ABNORMAL LOW (ref 0.7–4.0)
MCHC: 33.5 g/dL (ref 30.0–36.0)
MCV: 97.2 fL (ref 78.0–100.0)
Monocytes Absolute: 0.6 10*3/uL (ref 0.1–1.0)
Monocytes Relative: 12.7 % — ABNORMAL HIGH (ref 3.0–12.0)
Neutro Abs: 3.9 10*3/uL (ref 1.4–7.7)
Neutrophils Relative %: 76.3 % (ref 43.0–77.0)
Platelets: 127 10*3/uL — ABNORMAL LOW (ref 150.0–400.0)
RBC: 4.25 Mil/uL (ref 3.87–5.11)
RDW: 14.3 % (ref 11.5–15.5)
WBC: 5.1 10*3/uL (ref 4.0–10.5)

## 2023-10-29 LAB — LIPID PANEL
Cholesterol: 157 mg/dL (ref 0–200)
HDL: 50.5 mg/dL (ref >39.00)
LDL Cholesterol: 95 mg/dL (ref 0–99)
NonHDL: 106.91
Total CHOL/HDL Ratio: 3
Triglycerides: 62 mg/dL (ref 0.0–149.0)
VLDL: 12.4 mg/dL (ref 0.0–40.0)

## 2023-10-29 LAB — TSH: TSH: 1.17 u[IU]/mL (ref 0.35–5.50)

## 2023-10-29 LAB — VITAMIN B12: Vitamin B-12: 558 pg/mL (ref 211–911)

## 2023-10-29 MED ORDER — HYDROXYZINE PAMOATE 25 MG PO CAPS: ORAL_CAPSULE | ORAL | 0 refills | Status: DC

## 2023-10-29 MED ORDER — SERTRALINE HCL 50 MG PO TABS: 50.0000 mg | ORAL_TABLET | Freq: Every day | ORAL | 0 refills | Status: DC

## 2023-10-29 NOTE — Progress Notes (Signed)
Thyroid and b12 level normal limits.  Cholesterol good levels.  Plts do still remain low, chronic however from the last six years. She has been referred already as of today to hematology as well for chronic thrombocytopenia and leukopenia. Wbc do seem to have went back to normal from three weeks ago.   LAB: CAN WE ADD ANA + REFLEX OR DIFFERENT TUBE? ORDERS IN.

## 2023-10-29 NOTE — Progress Notes (Signed)
New Patient Office Visit  Subjective:  Patient ID: Holly Carter, female    DOB: [birth year]  Age: 67 y.o. MRN: [id number]  CC:  Chief Complaint  Patient presents with   New Patient (Initial Visit)    HPI Holly Carter is here to establish care as a new patient.  Oriented to practice routines and expectations. Has sister in office as well as niece on phone who is interpreting for [place] ( spanish speaking). Our interpretor service was not working at the time of visit.   Prior provider was: has not had a PCP for some time.   Pt is with acute concerns.  Bil LE are itching but she states this happens when she runs out of her medication.  chronic concerns:  Recent cholecystectomy, currently taking tramadol as needed and zofran as needed. Has f/u appt later today, doing well with this.   Anxiety/depression: was taking sertraline 50 mg once daily for this however she has since ran out so wanted to establish to get back on this. She states while taking this medication she felt much better. She has been on this for over 20 years. Crying on and off. She had been seeing a therapist while living in [place] (a few years ago).   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 Seems to be stable from six years ago.        ROS: Negative unless specifically indicated above in HPI.   Current Outpatient Medications:    hydrOXYzine (VISTARIL) 25 MG capsule, Take 1 to 1/2 tablet daily prn anxiety, Disp: 30 capsule, Rfl: 0   meloxicam (MOBIC) 15 MG tablet, Take 1 tablet (15 mg total) by mouth daily., Disp: 10 tablet, Rfl: 0   ondansetron (ZOFRAN-ODT) 4 MG disintegrating tablet, Take 1 tablet (4 mg total) by mouth every 8 (eight) hours as needed., Disp: 15 tablet, Rfl: 0   traMADol (ULTRAM) 50 MG tablet, Take 1 tablet (50 mg total) by mouth every 6 (six) hours as needed., Disp: 20 tablet, Rfl: 0   sertraline  (ZOLOFT) 50 MG tablet, Take 1 tablet (50 mg total) by mouth daily., Disp: 90 tablet, Rfl: 0 Past Medical History:  Diagnosis Date   Arthritis    CCC (chronic calculous cholecystitis)    Depression    DVT, lower extremity (HCC)    30 years ago in [place] after appendix was removed   History of kidney stones    Hypertension    Pneumonia    Pre-diabetes    Past Surgical History:  Procedure Laterality Date   APPENDECTOMY     in [place]   lithotripsy with stent     TOTAL VAGINAL HYSTERECTOMY      Objective:   Today's Vitals: BP 124/68   Pulse 78   Temp 98.1 F (36.7 C) (Oral)   Ht 4' 10.5" (1.486 m)   Wt 160 lb 4 oz (72.7 kg)   SpO2 97%   BMI 32.92 kg/m   Physical Exam Constitutional:      General: She is not in acute distress.    Appearance: Normal appearance. She is obese. She is not ill-appearing, toxic-appearing or diaphoretic.  HENT:     Head: Normocephalic.  Cardiovascular:     Rate and Rhythm: Normal rate.  Pulmonary:     Effort: Pulmonary effort is normal.  Musculoskeletal:        General: Normal range of motion.  Neurological:     General: No focal deficit present.     Mental Status: She is alert and oriented to person, place, and time. Mental status is at baseline.  Psychiatric:        Attention and Perception: Attention normal.        Mood and Affect: Mood is depressed. Affect is tearful.        Speech: Speech normal.        Behavior: Behavior is agitated. Behavior is cooperative.        Thought Content: Thought content normal. Thought content does not include homicidal or suicidal ideation. Thought content does not include homicidal or suicidal plan.        Cognition and Memory: Cognition and memory normal.        Judgment: Judgment normal.     Assessment & Plan:  Anxiety and depression Assessment & Plan: Restart sertraline 50 mg once daily  Hydroxyxine prn  Referral placed for therapist  Orders: -     Sertraline HCl; Take 1 tablet (50 mg total)  by mouth daily.  Dispense: 90 tablet; Refill: 0 -     hydrOXYzine Pamoate; Take 1 to 1/2 tablet daily prn anxiety  Dispense: 30 capsule; Refill: 0 -     Ambulatory referral to Psychology  S/P cholecystectomy Assessment & Plan: Pt with f/u with surgeon today.    Thrombocytopenia (HCC) -     Ambulatory referral to Hematology / Oncology -     CBC with Differential/Platelet  Leukopenia, unspecified type Assessment & Plan: Repeat cbc today  Likely ethnic variant however has never had workup so will refer to hematology  Orders: -     Ambulatory referral to Hematology / Oncology -     CBC with Differential/Platelet  Other fatigue Assessment & Plan: Workup with ordering tsh b12 and cbc pending results  Orders: -     Ambulatory referral to Hematology / Oncology -     TSH -     Vitamin B12 -     CBC with Differential/Platelet  Screening for lipoid disorders -     Lipid panel    Follow-up: Return in about 2 months (around 12/30/2023) for f/u anxiety, f/u depression.   Mort Sawyers, FNP

## 2023-10-29 NOTE — Assessment & Plan Note (Signed)
Repeat cbc today  Likely ethnic variant however has never had workup so will refer to hematology

## 2023-10-29 NOTE — Assessment & Plan Note (Signed)
Workup with ordering tsh b12 and cbc pending results

## 2023-10-29 NOTE — Assessment & Plan Note (Signed)
Restart sertraline 50 mg once daily  Hydroxyxine prn  Referral placed for therapist

## 2023-10-29 NOTE — Assessment & Plan Note (Signed)
Pt with f/u with surgeon today.

## 2023-10-30 ENCOUNTER — Other Ambulatory Visit: Payer: 59

## 2023-10-30 DIAGNOSIS — D72828 Other elevated white blood cell count: Secondary | ICD-10-CM | POA: Diagnosis not present

## 2023-11-03 LAB — ANA W/REFLEX: Anti Nuclear Antibody (ANA): NEGATIVE

## 2023-11-03 NOTE — Progress Notes (Signed)
Negative ana

## 2023-11-06 ENCOUNTER — Encounter: Payer: Self-pay | Admitting: Family

## 2023-11-30 ENCOUNTER — Other Ambulatory Visit: Payer: Self-pay | Admitting: Family

## 2023-11-30 DIAGNOSIS — F32A Depression, unspecified: Secondary | ICD-10-CM

## 2023-11-30 MED ORDER — HYDROXYZINE PAMOATE 25 MG PO CAPS: ORAL_CAPSULE | ORAL | 0 refills | Status: DC

## 2023-11-30 NOTE — Telephone Encounter (Signed)
Copied from CRM [id number]. Topic: Clinical - Medication Refill >> Nov 30, 2023  9:57 AM Isabell A wrote: Most Recent Primary Care Visit:  Provider: Mort Sawyers  Department: [facility]  Visit Type: NEW PATIENT  Date: 10/29/2023  Medication: hydrOXYzine (VISTARIL) 25 MG capsule  Has the patient contacted their pharmacy? No (Agent: If no, request that the patient contact the pharmacy for the refill. If patient does not wish to contact the pharmacy document the reason why and proceed with request.) (Agent: If yes, when and what did the pharmacy advise?)  Is this the correct pharmacy for this prescription? Yes If no, delete pharmacy and type the correct one.  This is the patient's preferred pharmacy:  [id number] - [place], [place] - [address] AT [facility] [address] [place] [address] Phone: [phone] Fax: [phone]  Has the prescription been filled recently? Yes  Is the patient out of the medication? Yes  Has the patient been seen for an appointment in the last year OR does the patient have an upcoming appointment? Yes  Can we respond through MyChart? No  Agent: Please be advised that Rx refills may take up to 3 business days. We ask that you follow-up with your pharmacy.

## 2023-11-30 NOTE — Addendum Note (Signed)
Addended by: Jaynee Eagles C on: 11/30/2023 10:24 AM   Modules accepted: Orders

## 2023-12-02 ENCOUNTER — Telehealth: Payer: Self-pay | Admitting: Family

## 2023-12-02 DIAGNOSIS — F32A Depression, unspecified: Secondary | ICD-10-CM

## 2023-12-03 NOTE — Telephone Encounter (Signed)
Completed.

## 2023-12-07 ENCOUNTER — Other Ambulatory Visit: Payer: Self-pay | Admitting: *Deleted

## 2023-12-07 MED ORDER — HYDROXYZINE HCL 25 MG PO TABS: 12.5000 mg | ORAL_TABLET | Freq: Every day | ORAL | 0 refills | Status: DC | PRN

## 2023-12-07 NOTE — Telephone Encounter (Signed)
Received a fax from [facility] about pt's Hydroxyzine rx. Original rx was sent in for capsules. Sig read take 0.5 to 1 capsule daily prn. Capsules can't be broken in half. New rx has been sent in for tablet form.

## 2023-12-11 DIAGNOSIS — M170 Bilateral primary osteoarthritis of knee: Secondary | ICD-10-CM | POA: Diagnosis not present

## 2023-12-30 ENCOUNTER — Ambulatory Visit: Payer: 59 | Admitting: Family

## 2024-01-04 ENCOUNTER — Telehealth: Payer: Self-pay | Admitting: Family

## 2024-01-04 ENCOUNTER — Encounter: Payer: Self-pay | Admitting: Family

## 2024-01-04 ENCOUNTER — Ambulatory Visit (INDEPENDENT_AMBULATORY_CARE_PROVIDER_SITE_OTHER): Payer: 59 | Admitting: Family

## 2024-01-04 VITALS — BP 124/82 | HR 62 | Temp 98.0°F | Ht 58.5 in | Wt 165.6 lb

## 2024-01-04 DIAGNOSIS — R200 Anesthesia of skin: Secondary | ICD-10-CM | POA: Diagnosis not present

## 2024-01-04 DIAGNOSIS — M79605 Pain in left leg: Secondary | ICD-10-CM

## 2024-01-04 DIAGNOSIS — R0609 Other forms of dyspnea: Secondary | ICD-10-CM

## 2024-01-04 DIAGNOSIS — R12 Heartburn: Secondary | ICD-10-CM | POA: Diagnosis not present

## 2024-01-04 DIAGNOSIS — F419 Anxiety disorder, unspecified: Secondary | ICD-10-CM

## 2024-01-04 DIAGNOSIS — R001 Bradycardia, unspecified: Secondary | ICD-10-CM | POA: Insufficient documentation

## 2024-01-04 DIAGNOSIS — R202 Paresthesia of skin: Secondary | ICD-10-CM

## 2024-01-04 DIAGNOSIS — G6289 Other specified polyneuropathies: Secondary | ICD-10-CM

## 2024-01-04 DIAGNOSIS — R600 Localized edema: Secondary | ICD-10-CM

## 2024-01-04 DIAGNOSIS — G629 Polyneuropathy, unspecified: Secondary | ICD-10-CM | POA: Insufficient documentation

## 2024-01-04 DIAGNOSIS — M79604 Pain in right leg: Secondary | ICD-10-CM | POA: Insufficient documentation

## 2024-01-04 DIAGNOSIS — R29898 Other symptoms and signs involving the musculoskeletal system: Secondary | ICD-10-CM | POA: Diagnosis not present

## 2024-01-04 DIAGNOSIS — F32A Depression, unspecified: Secondary | ICD-10-CM | POA: Diagnosis not present

## 2024-01-04 LAB — POCT GLYCOSYLATED HEMOGLOBIN (HGB A1C): Hemoglobin A1C: 5.5 % (ref 4.0–5.6)

## 2024-01-04 MED ORDER — OMEPRAZOLE 20 MG PO CPDR
20.0000 mg | DELAYED_RELEASE_CAPSULE | Freq: Every day | ORAL | 0 refills | Status: DC
Start: 2024-01-04 — End: 2024-01-04

## 2024-01-04 MED ORDER — SERTRALINE HCL 100 MG PO TABS
100.0000 mg | ORAL_TABLET | Freq: Every day | ORAL | 0 refills | Status: DC
Start: 2024-01-04 — End: 2024-06-14

## 2024-01-04 MED ORDER — OMEPRAZOLE 20 MG PO CPDR: 20.0000 mg | DELAYED_RELEASE_CAPSULE | Freq: Every day | ORAL | 0 refills | Status: DC

## 2024-01-04 NOTE — Patient Instructions (Addendum)
  Hematology, please call to schedule appointment:   Address: [address], [place], [place] [address] Confirmed by this business 7 weeks ago Phone: [phone]  We will let you know when we receive the phone # for the therapist for you to call out and reach them.   ------------------------------------  Recommend starting to take over the counter magnesium. 300 mg is fine.   ------------------------------------  I have ordered an echocardiogram which is an ultrasound of your heart for your shortness of breath.  ------------------------------------  Try to decrease and or avoid spicy foods, fried fatty foods, and also caffeine and chocolate as these can increase heartburn symptoms.  Trial omeprazole for 30 days to see if this is helpful.

## 2024-01-04 NOTE — Assessment & Plan Note (Addendum)
 Associated with peripheral neuropathy as well as numbness and tingling of bilateral lower extremities I am going to refer her to vascular to evaluate for PAD versus vascular insufficiency.  I did also recommend compression stockings however patient states this causes her more pain so we will hold off for now.  Advised for her to elevate legs when she is sitting and try to get around more often during the day.  She is going to start physical therapy which will be helpful for getting her moving.  Advised patient to watch salt in her diet as well.

## 2024-01-04 NOTE — Progress Notes (Signed)
 Pt is not diabetic or prediabetic

## 2024-01-04 NOTE — Telephone Encounter (Signed)
 Good morning   This pt is here for f/u and referral was closed for therapy.  I did confirm her phone # and they were correct she somehow states she didn't get a VM, maybe she didn't realize.   What number can she call to get scheduled with you guys?

## 2024-01-04 NOTE — Progress Notes (Signed)
 Established Patient Office Visit  Subjective:      CC:  Chief Complaint  Patient presents with   Medical Management of Chronic Issues    HPI: Holly Carter is a 68 y.o. female presenting on 01/04/2024 for Medical Management of Chronic Issues . Chronic thrombocytopenia and leukopenia, referred to hematology last visit 10/29/23. They were unable to schedule with patient as called without answer, so the referral was closed.  Anxiety depression: restarted on sertraline 50 mg last visit.  Referral was placed for therapy. Hydroxyxine prn.  She does have concerns in her bil lower ext states often with swelling of her lower extremities. She does feel at certain times she will have lower extremity numbness, and she notices change in color purple at times. Almost always numb for the most part she states. No lower back pain. She feels the radiation of the numbness from knee down. She states that she is not overly active especially with it being cold outside. Knees will also cause her pain. Her daughter states she very rarely is active, sits a lot watching tv, even though they encourage her to move around. She states she doesn't salt her food very often, but she does eat soup, beans often and mostly from can. She also reports that she has increasing muscle spasms in her lower legs. She admits only drinking about two glasses of water daily.   She is seeing an orthopedist for her knees and she receives injections every three months. She has been referred to physical therapy and has an upcoming appointment. She is unable to walk longer distances without having worsening weakness in her LE.   She does often report worsening sob and doe. She does have heartburn but denies chest pain. She does admit to eating fried food often.  Social history:  Relevant past medical, surgical, family and social history reviewed and updated as indicated. Interim medical history since our last visit  reviewed.  Allergies and medications reviewed and updated.  DATA REVIEWED: CHART IN EPIC     ROS: Negative unless specifically indicated above in HPI.    Current Outpatient Medications:    hydrOXYzine (ATARAX) 25 MG tablet, Take 0.5-1 tablets (12.5-25 mg total) by mouth daily as needed., Disp: 30 tablet, Rfl: 0   sertraline (ZOLOFT) 100 MG tablet, Take 1 tablet (100 mg total) by mouth daily., Disp: 90 tablet, Rfl: 0   omeprazole (PRILOSEC) 20 MG capsule, Take 1 capsule (20 mg total) by mouth daily., Disp: 90 capsule, Rfl: 0      Objective:    BP 124/82 (BP Location: Left Arm, Patient Position: Sitting, Cuff Size: Large)   Pulse 62   Temp 98 F (36.7 C) (Temporal)   Ht 4' 10.5" (1.486 m)   Wt 165 lb 9.6 oz (75.1 kg)   SpO2 98%   BMI 34.02 kg/m   Wt Readings from Last 3 Encounters:  01/04/24 165 lb 9.6 oz (75.1 kg)  10/29/23 160 lb 4 oz (72.7 kg)  10/08/23 167 lb (75.8 kg)    Physical Exam Constitutional:      General: She is not in acute distress.    Appearance: Normal appearance. She is normal weight. She is not ill-appearing, toxic-appearing or diaphoretic.  HENT:     Head: Normocephalic.  Cardiovascular:     Rate and Rhythm: Normal rate and regular rhythm.  Pulmonary:     Effort: Pulmonary effort is normal.  Musculoskeletal:        General: Normal range of  motion.     Right lower leg: 1+ Edema present.     Left lower leg: 1+ Edema present.  Neurological:     General: No focal deficit present.     Mental Status: She is alert and oriented to person, place, and time. Mental status is at baseline.  Psychiatric:        Mood and Affect: Mood normal.        Behavior: Behavior normal.        Thought Content: Thought content normal.        Judgment: Judgment normal.    Title   Diabetic Foot Exam - detailed Is there a history of foot ulcer?: No Is there a foot ulcer now?: No Is there swelling?: Yes Is there elevated skin temperature?: No Is there abnormal  foot shape?: No Is there a claw toe deformity?: No Are the toenails long?: No Are the toenails thick?: No Are the toenails ingrown?: No Is the skin thin, fragile, shiny and hairless?": No Normal Range of Motion?: Yes Is there foot or ankle muscle weakness?: Yes Do you have pain in calf while walking?: Yes Are the shoes appropriate in style and fit?: Yes Can the patient see the bottom of their feet?: No Pulse Foot Exam completed.: Yes   Right Posterior Tibialis: Diminished Left posterior Tibialis: Diminished   Right Dorsalis Pedis: Present Left Dorsalis Pedis: Present     Semmes-Weinstein Monofilament Test "+" means "has sensation" and "-" means "no sensation"  R Foot Test Control: Pos L Foot Test Control: Pos   R Site 1-Great Toe: Neg L Site 1-Great Toe: Neg   R Site 4: Pos L Site 4: Pos   R site 5: Neg L Site 5: Neg  R Site 6: Neg L Site 6: Neg     Image components are not supported.   Image components are not supported. Image components are not supported.  [place] Comments            Assessment & Plan:  Anxiety and depression Assessment & Plan: Improving however could still improve more. We will increase sertraline to 100 mg once daily. Consider new referral therapy for patient to be called to get set up.  She is aware to let me know if she does not hear from them in the next week or 2 Continue hydroxyxine prn    Orders: -     Sertraline HCl; Take 1 tablet (100 mg total) by mouth daily.  Dispense: 90 tablet; Refill: 0  Pedal edema Assessment & Plan: Associated with peripheral neuropathy as well as numbness and tingling of bilateral lower extremities I am going to refer her to vascular to evaluate for PAD versus vascular insufficiency.  I did also recommend compression stockings however patient states this causes her more pain so we will hold off for now.  Advised for her to elevate legs when she is sitting and try to get around more often during the day.   She is going to start physical therapy which will be helpful for getting her moving.  Advised patient to watch salt in her diet as well.  Orders: -     Ambulatory referral to Vascular Surgery  Bilateral leg weakness  Other polyneuropathy -     POCT glycosylated hemoglobin (Hb A1C) -     Ambulatory referral to Vascular Surgery  Bilateral leg pain -     Ambulatory referral to Vascular Surgery  Numbness and tingling of both lower extremities -  Ambulatory referral to Vascular Surgery  DOE (dyspnea on exertion) Assessment & Plan: Worsening will order echocardiogram to rule out heart causes.  Can consider chest x-ray in the future or reactive airway disease.  However with combinations of pedal edema will order this first.  Orders: -     ECHOCARDIOGRAM COMPLETE; Future  Heartburn Assessment & Plan: Trial omeprazole 20 mg once daily for 30 days. Try to decrease and or avoid spicy foods, fried fatty foods, and also caffeine and chocolate as these can increase heartburn symptoms.    Orders: -     Omeprazole; Take 1 capsule (20 mg total) by mouth daily.  Dispense: 90 capsule; Refill: 0  Bradycardia     Return in about 1 month (around 02/04/2024) for multiple concerns.  Mort Sawyers, MSN, APRN, FNP-C [facility]

## 2024-01-04 NOTE — Assessment & Plan Note (Signed)
 Trial omeprazole 20 mg once daily for 30 days. Try to decrease and or avoid spicy foods, fried fatty foods, and also caffeine and chocolate as these can increase heartburn symptoms.

## 2024-01-04 NOTE — Telephone Encounter (Signed)
 I called Holly Carter's primary phone. Her daughter, Alexa, answered. She said she has multiple messages on her phone regarding her mom. Unfortunately, Alexa's name is not on the DPR. I advised her to have her mom sign a DPR  for [place] to discuss her mother's care. She said she will be back in town Friday and will come by the office to pick one up.

## 2024-01-04 NOTE — Assessment & Plan Note (Signed)
 Worsening will order echocardiogram to rule out heart causes.  Can consider chest x-ray in the future or reactive airway disease.  However with combinations of pedal edema will order this first.

## 2024-01-04 NOTE — Assessment & Plan Note (Addendum)
 Improving however could still improve more. We will increase sertraline to 100 mg once daily. Consider new referral therapy for patient to be called to get set up.  She is aware to let me know if she does not hear from them in the next week or 2 Continue hydroxyxine prn

## 2024-01-05 NOTE — Telephone Encounter (Signed)
 Copied from CRM [id number]. Topic: General - Other >> Jan 05, 2024  2:01 PM Truddie Crumble wrote: Reason for CRM: patient daughter called wanting to know where the patient is getting the ultrasound done and making an appointment for blood work

## 2024-01-05 NOTE — Telephone Encounter (Signed)
 Thank you so much for the heads up this is great.

## 2024-01-05 NOTE — Telephone Encounter (Signed)
 Pt's daughter Tayanna called back asking if she can be contacted instead at [phone] because the pt does not speak [place].

## 2024-01-08 DIAGNOSIS — M25511 Pain in right shoulder: Secondary | ICD-10-CM | POA: Diagnosis not present

## 2024-01-08 DIAGNOSIS — M25512 Pain in left shoulder: Secondary | ICD-10-CM | POA: Diagnosis not present

## 2024-01-11 DIAGNOSIS — G8929 Other chronic pain: Secondary | ICD-10-CM | POA: Diagnosis not present

## 2024-01-11 DIAGNOSIS — M25512 Pain in left shoulder: Secondary | ICD-10-CM | POA: Diagnosis not present

## 2024-01-11 DIAGNOSIS — M25511 Pain in right shoulder: Secondary | ICD-10-CM | POA: Diagnosis not present

## 2024-01-14 DIAGNOSIS — M25511 Pain in right shoulder: Secondary | ICD-10-CM | POA: Diagnosis not present

## 2024-01-14 DIAGNOSIS — G8929 Other chronic pain: Secondary | ICD-10-CM | POA: Diagnosis not present

## 2024-01-14 DIAGNOSIS — M25512 Pain in left shoulder: Secondary | ICD-10-CM | POA: Diagnosis not present

## 2024-01-18 DIAGNOSIS — M25511 Pain in right shoulder: Secondary | ICD-10-CM | POA: Diagnosis not present

## 2024-01-18 DIAGNOSIS — M25512 Pain in left shoulder: Secondary | ICD-10-CM | POA: Diagnosis not present

## 2024-01-18 DIAGNOSIS — G8929 Other chronic pain: Secondary | ICD-10-CM | POA: Diagnosis not present

## 2024-01-20 DIAGNOSIS — M25511 Pain in right shoulder: Secondary | ICD-10-CM | POA: Diagnosis not present

## 2024-01-20 DIAGNOSIS — G8929 Other chronic pain: Secondary | ICD-10-CM | POA: Diagnosis not present

## 2024-01-20 DIAGNOSIS — M25512 Pain in left shoulder: Secondary | ICD-10-CM | POA: Diagnosis not present

## 2024-01-25 DIAGNOSIS — M25511 Pain in right shoulder: Secondary | ICD-10-CM | POA: Diagnosis not present

## 2024-01-25 DIAGNOSIS — M25512 Pain in left shoulder: Secondary | ICD-10-CM | POA: Diagnosis not present

## 2024-01-25 DIAGNOSIS — G8929 Other chronic pain: Secondary | ICD-10-CM | POA: Diagnosis not present

## 2024-01-28 DIAGNOSIS — M25511 Pain in right shoulder: Secondary | ICD-10-CM | POA: Diagnosis not present

## 2024-01-28 DIAGNOSIS — M25512 Pain in left shoulder: Secondary | ICD-10-CM | POA: Diagnosis not present

## 2024-01-28 DIAGNOSIS — G8929 Other chronic pain: Secondary | ICD-10-CM | POA: Diagnosis not present

## 2024-02-04 DIAGNOSIS — M25512 Pain in left shoulder: Secondary | ICD-10-CM | POA: Diagnosis not present

## 2024-02-04 DIAGNOSIS — G8929 Other chronic pain: Secondary | ICD-10-CM | POA: Diagnosis not present

## 2024-02-04 DIAGNOSIS — M25511 Pain in right shoulder: Secondary | ICD-10-CM | POA: Diagnosis not present

## 2024-02-08 ENCOUNTER — Ambulatory Visit (INDEPENDENT_AMBULATORY_CARE_PROVIDER_SITE_OTHER): Admitting: Family

## 2024-02-08 ENCOUNTER — Encounter: Payer: Self-pay | Admitting: Family

## 2024-02-08 VITALS — BP 118/70 | HR 71 | Temp 97.9°F | Ht 58.5 in | Wt 162.2 lb

## 2024-02-08 DIAGNOSIS — D72819 Decreased white blood cell count, unspecified: Secondary | ICD-10-CM

## 2024-02-08 DIAGNOSIS — K769 Liver disease, unspecified: Secondary | ICD-10-CM | POA: Insufficient documentation

## 2024-02-08 DIAGNOSIS — D696 Thrombocytopenia, unspecified: Secondary | ICD-10-CM

## 2024-02-08 DIAGNOSIS — K146 Glossodynia: Secondary | ICD-10-CM | POA: Diagnosis not present

## 2024-02-08 DIAGNOSIS — R1010 Upper abdominal pain, unspecified: Secondary | ICD-10-CM | POA: Insufficient documentation

## 2024-02-08 DIAGNOSIS — R12 Heartburn: Secondary | ICD-10-CM

## 2024-02-08 DIAGNOSIS — K5790 Diverticulosis of intestine, part unspecified, without perforation or abscess without bleeding: Secondary | ICD-10-CM | POA: Insufficient documentation

## 2024-02-08 DIAGNOSIS — R339 Retention of urine, unspecified: Secondary | ICD-10-CM | POA: Diagnosis not present

## 2024-02-08 LAB — COMPREHENSIVE METABOLIC PANEL WITH GFR
ALT: 29 U/L (ref 0–35)
AST: 19 U/L (ref 0–37)
Albumin: 4.2 g/dL (ref 3.5–5.2)
Alkaline Phosphatase: 88 U/L (ref 39–117)
BUN: 26 mg/dL — ABNORMAL HIGH (ref 6–23)
CO2: 30 meq/L (ref 19–32)
Calcium: 8.9 mg/dL (ref 8.4–10.5)
Chloride: 109 meq/L (ref 96–112)
Creatinine, Ser: 0.52 mg/dL (ref 0.40–1.20)
GFR: 96.12 mL/min (ref >60.00)
Glucose, Bld: 102 mg/dL — ABNORMAL HIGH (ref 70–99)
Potassium: 3.9 meq/L (ref 3.5–5.1)
Sodium: 146 meq/L — ABNORMAL HIGH (ref 135–145)
Total Bilirubin: 0.4 mg/dL (ref 0.2–1.2)
Total Protein: 6.5 g/dL (ref 6.0–8.3)

## 2024-02-08 LAB — LIPASE: Lipase: 25 U/L (ref 11.0–59.0)

## 2024-02-08 LAB — CBC WITH DIFFERENTIAL/PLATELET
Basophils Absolute: 0 10*3/uL (ref 0.0–0.1)
Basophils Relative: 0.2 % (ref 0.0–3.0)
Eosinophils Absolute: 0 10*3/uL (ref 0.0–0.7)
Eosinophils Relative: 0.5 % (ref 0.0–5.0)
HCT: 41.1 % (ref 36.0–46.0)
Hemoglobin: 13.6 g/dL (ref 12.0–15.0)
Lymphocytes Relative: 20.1 % (ref 12.0–46.0)
Lymphs Abs: 0.8 10*3/uL (ref 0.7–4.0)
MCHC: 33.2 g/dL (ref 30.0–36.0)
MCV: 100.2 fl — ABNORMAL HIGH (ref 78.0–100.0)
Monocytes Absolute: 0.4 10*3/uL (ref 0.1–1.0)
Monocytes Relative: 11.2 % (ref 3.0–12.0)
Neutro Abs: 2.5 10*3/uL (ref 1.4–7.7)
Neutrophils Relative %: 68 % (ref 43.0–77.0)
Platelets: 131 10*3/uL — ABNORMAL LOW (ref 150.0–400.0)
RBC: 4.1 Mil/uL (ref 3.87–5.11)
RDW: 14.8 % (ref 11.5–15.5)
WBC: 3.7 10*3/uL — ABNORMAL LOW (ref 4.0–10.5)

## 2024-02-08 LAB — AMYLASE: Amylase: 31 U/L (ref 27–131)

## 2024-02-08 MED ORDER — NYSTATIN 100000 UNIT/ML MT SUSP: 5.0000 mL | Freq: Four times a day (QID) | OROMUCOSAL | 0 refills | Status: AC

## 2024-02-08 MED ORDER — NYSTATIN 100000 UNIT/ML MT SUSP: 5.0000 mL | Freq: Four times a day (QID) | OROMUCOSAL | 0 refills | Status: DC

## 2024-02-08 NOTE — Progress Notes (Signed)
 Established Patient Office Visit  Subjective:   Patient ID: Holly Carter, female    DOB: 1956-08-05  Age: 68 y.o. MRN: [id number]  CC:  Chief Complaint  Patient presents with   Medical Management of Chronic Issues    HPI: Holly Carter is a 68 y.o. female presenting on 02/08/2024 for Medical Management of Chronic Issues  Here with interpretor present.   Heartburn, started prilosec 20 mg she states the heartburn has improved but still feels bloated. Has decreased spicy food and trying to eat lower fat foods.   DOE: had ordered echocardiogram has it scheduled for April. No change, only when she walks longer distances.   She does state she has been feeling as though her tongue 'has been burned', has been for about two weeks. She states it feels 'burning' and it is constant. She has not changed any toothpaste or mouth wash.   Plts remain low but stable.  She was referred to hematology back in December, but was unable to schedule due to not reaching patient. Looks like referral has been closed.   She does at times feel as though she ges food stuck in her epigastric area, will feel 'suffocated' until passes. She states omeprazole has helped this sensation and flatulence is improved but does still have these sensations at times. She states that the bloating worsens after she has eaten.   She does at times pee, and feels as though she doesn't pee completely. She has felt this way since she had     ROS: Negative unless specifically indicated above in HPI.   Relevant past medical history reviewed and updated as indicated.   Allergies and medications reviewed and updated.   Current Outpatient Medications:    hydrOXYzine (ATARAX) 25 MG tablet, Take 0.5-1 tablets (12.5-25 mg total) by mouth daily as needed., Disp: 30 tablet, Rfl: 0   nystatin (MYCOSTATIN) 100000 UNIT/ML suspension, Take 5 mLs (500,000 Units total) by mouth 4 (four) times daily for 7 days., Disp: 60 mL, Rfl:  0   omeprazole (PRILOSEC) 20 MG capsule, Take 1 capsule (20 mg total) by mouth daily., Disp: 90 capsule, Rfl: 0   sertraline (ZOLOFT) 100 MG tablet, Take 1 tablet (100 mg total) by mouth daily., Disp: 90 tablet, Rfl: 0  No Known Allergies  Objective:   BP 118/70 (BP Location: Right Arm, Patient Position: Sitting)   Pulse 71   Temp 97.9 F (36.6 C) (Temporal)   Ht 4' 10.5" (1.486 m)   Wt 162 lb 3.2 oz (73.6 kg)   SpO2 97%   BMI 33.32 kg/m    Physical Exam Constitutional:      General: She is not in acute distress.    Appearance: Normal appearance. She is obese. She is not ill-appearing, toxic-appearing or diaphoretic.  HENT:     Head: Normocephalic.     Mouth/Throat:     Tongue: Lesions (white budding on base of erythema with tongue) present.  Cardiovascular:     Rate and Rhythm: Normal rate.  Pulmonary:     Effort: Pulmonary effort is normal.  Abdominal:     General: Bowel sounds are increased. There is no distension.     Palpations: Abdomen is soft.     Tenderness: There is abdominal tenderness in the right upper quadrant, epigastric area and left upper quadrant. There is no guarding.     Hernia: No hernia is present.  Musculoskeletal:        General: Normal range of motion.  Neurological:     General: No focal deficit present.     Mental Status: She is alert and oriented to person, place, and time. Mental status is at baseline.  Psychiatric:        Mood and Affect: Mood normal.        Behavior: Behavior normal.        Thought Content: Thought content normal.        Judgment: Judgment normal.     Assessment & Plan:  Heartburn Assessment & Plan: Continue omeprazole as beneficial however did advise pt to work strictly on diet and we will discuss at f/u about d/c   Orders: -     Alpha-Gal Panel  Thrombocytopenia (HCC)  Leukopenia, unspecified type Assessment & Plan: Referral had been placed for hematology however they were unable to contact pt  Have updated  phone # and will re attempt referral for eval/treat  Orders: -     Ambulatory referral to Hematology / Oncology -     CBC with Differential/Platelet  Urinary retention -     Urinalysis w microscopic + reflex cultur  Tongue burning sensation Assessment & Plan: Suspected oral thrush  Rx nystatin 5 ml qid x 7 days  Orders: -     Nystatin; Take 5 mLs (500,000 Units total) by mouth 4 (four) times daily for 7 days.  Dispense: 60 mL; Refill: 0  Upper abdominal pain Assessment & Plan: Ongoing.  U/s ordered today pending results.  S/p cholecystectomy Evaluate liver lesion, assess for pancreatitis, inflammation, spleenomegaly, hepatomegaly Cmp, amylase lipase and cbc ordered pending results.    Orders: -     H. pylori breath test -     Comprehensive metabolic panel with GFR -     Amylase -     Lipase -     US Abdomen Complete; Future  Diverticulosis  Lesion of liver Assessment & Plan: Noted on CT scan 10/2023 U/s today for sx and will see what we can evaluate   Orders: -     US Abdomen Complete; Future     Follow up plan: Return in about 3 months (around 05/09/2024) for f/u abdominal pain.  Mort Sawyers, FNP

## 2024-02-08 NOTE — Assessment & Plan Note (Signed)
 Suspected oral thrush  Rx nystatin 5 ml qid x 7 days

## 2024-02-08 NOTE — Assessment & Plan Note (Signed)
 Ongoing.  U/s ordered today pending results.  S/p cholecystectomy Evaluate liver lesion, assess for pancreatitis, inflammation, spleenomegaly, hepatomegaly Cmp, amylase lipase and cbc ordered pending results.

## 2024-02-08 NOTE — Assessment & Plan Note (Signed)
 Continue omeprazole as beneficial however did advise pt to work strictly on diet and we will discuss at f/u about d/c

## 2024-02-08 NOTE — Assessment & Plan Note (Signed)
 Referral had been placed for hematology however they were unable to contact pt  Have updated phone # and will re attempt referral for eval/treat

## 2024-02-08 NOTE — Addendum Note (Signed)
 Addended by: Jaynee Eagles C on: 02/08/2024 03:03 PM   Modules accepted: Orders

## 2024-02-08 NOTE — Assessment & Plan Note (Signed)
 Noted on CT scan 10/2023 U/s today for sx and will see what we can evaluate

## 2024-02-09 NOTE — Progress Notes (Signed)
 Platelets and white blood cells remain stable but low.  Watch your sodium intake (salt) Canned foods, processed foods etc H pylori negative Urine culture pending to rule out UTI

## 2024-02-11 ENCOUNTER — Other Ambulatory Visit: Payer: Self-pay | Admitting: Family

## 2024-02-11 ENCOUNTER — Ambulatory Visit: Payer: Self-pay | Admitting: *Deleted

## 2024-02-11 ENCOUNTER — Other Ambulatory Visit: Payer: Self-pay

## 2024-02-11 ENCOUNTER — Emergency Department

## 2024-02-11 ENCOUNTER — Emergency Department
Admission: EM | Admit: 2024-02-11 | Discharge: 2024-02-12 | Disposition: A | Discharge status: Home / Self Care | Attending: Emergency Medicine | Admitting: Emergency Medicine

## 2024-02-11 DIAGNOSIS — A499 Bacterial infection, unspecified: Secondary | ICD-10-CM

## 2024-02-11 DIAGNOSIS — R6883 Chills (without fever): Secondary | ICD-10-CM | POA: Diagnosis not present

## 2024-02-11 DIAGNOSIS — R112 Nausea with vomiting, unspecified: Secondary | ICD-10-CM | POA: Diagnosis not present

## 2024-02-11 DIAGNOSIS — N390 Urinary tract infection, site not specified: Secondary | ICD-10-CM | POA: Insufficient documentation

## 2024-02-11 DIAGNOSIS — R531 Weakness: Secondary | ICD-10-CM | POA: Diagnosis not present

## 2024-02-11 DIAGNOSIS — R519 Headache, unspecified: Secondary | ICD-10-CM | POA: Diagnosis not present

## 2024-02-11 DIAGNOSIS — H538 Other visual disturbances: Secondary | ICD-10-CM | POA: Diagnosis not present

## 2024-02-11 LAB — URINE CULTURE
MICRO NUMBER:: 16302237
SPECIMEN QUALITY:: ADEQUATE

## 2024-02-11 LAB — BASIC METABOLIC PANEL WITH GFR
Anion gap: 12 (ref 5–15)
BUN: 13 mg/dL (ref 8–23)
CO2: 21 mmol/L — ABNORMAL LOW (ref 22–32)
Calcium: 8.5 mg/dL — ABNORMAL LOW (ref 8.9–10.3)
Chloride: 99 mmol/L (ref 98–111)
Creatinine, Ser: 0.53 mg/dL (ref 0.44–1.00)
GFR, Estimated: >60 mL/min (ref >60)
Glucose, Bld: 154 mg/dL — ABNORMAL HIGH (ref 70–99)
Potassium: 3 mmol/L — ABNORMAL LOW (ref 3.5–5.1)
Sodium: 132 mmol/L — ABNORMAL LOW (ref 135–145)

## 2024-02-11 LAB — RESP PANEL BY RT-PCR (RSV, FLU A&B, COVID)  RVPGX2
Influenza A by PCR: NEGATIVE
Influenza B by PCR: NEGATIVE
Resp Syncytial Virus by PCR: NEGATIVE
SARS Coronavirus 2 by RT PCR: NEGATIVE

## 2024-02-11 LAB — ALPHA-GAL PANEL
Allergen, Mutton, f88: <0.1 kU/L
Allergen, Pork, f26: <0.1 kU/L
Beef: <0.1 kU/L
CLASS: 0
CLASS: 0
Class: 0
GALACTOSE-ALPHA-1,3-GALACTOSE IGE*: <0.1 kU/L (ref <0.10)

## 2024-02-11 LAB — URINALYSIS, ROUTINE W REFLEX MICROSCOPIC
Bilirubin Urine: NEGATIVE
Glucose, UA: NEGATIVE mg/dL
Ketones, ur: NEGATIVE mg/dL
Nitrite: POSITIVE — AB
Protein, ur: 100 mg/dL — AB
Specific Gravity, Urine: 1.023 (ref 1.005–1.030)
WBC, UA: >50 WBC/hpf (ref 0–5)
pH: 5 (ref 5.0–8.0)

## 2024-02-11 LAB — URINALYSIS W MICROSCOPIC + REFLEX CULTURE
Bilirubin Urine: NEGATIVE
Glucose, UA: NEGATIVE
Ketones, ur: NEGATIVE
Nitrites, Initial: POSITIVE — AB
Specific Gravity, Urine: 1.021 (ref 1.001–1.035)
WBC, UA: >=60 /HPF — AB (ref 0–5)
pH: 6 (ref 5.0–8.0)

## 2024-02-11 LAB — CBC
HCT: 41.8 % (ref 36.0–46.0)
Hemoglobin: 14.1 g/dL (ref 12.0–15.0)
MCH: 32.5 pg (ref 26.0–34.0)
MCHC: 33.7 g/dL (ref 30.0–36.0)
MCV: 96.3 fL (ref 80.0–100.0)
Platelets: 74 10*3/uL — ABNORMAL LOW (ref 150–400)
RBC: 4.34 MIL/uL (ref 3.87–5.11)
RDW: 13.4 % (ref 11.5–15.5)
WBC: 4.4 10*3/uL (ref 4.0–10.5)
nRBC: 0 % (ref 0.0–0.2)

## 2024-02-11 LAB — H. PYLORI BREATH TEST: H. pylori Breath Test: NOT DETECTED

## 2024-02-11 LAB — INTERPRETATION:

## 2024-02-11 LAB — CULTURE INDICATED

## 2024-02-11 MED ORDER — AMOXICILLIN-POT CLAVULANATE 875-125 MG PO TABS
1.0000 | ORAL_TABLET | Freq: Two times a day (BID) | ORAL | 0 refills | Status: DC
Start: 2024-02-11 — End: 2024-06-03

## 2024-02-11 MED ORDER — SODIUM CHLORIDE 0.9 % IV BOLUS
1000.0000 mL | Freq: Once | INTRAVENOUS | Status: AC
Start: 2024-02-11 — End: 2024-02-11
  Administered 2024-02-11: 1000 mL via INTRAVENOUS

## 2024-02-11 MED ORDER — ACETAMINOPHEN 325 MG PO TABS
650.0000 mg | ORAL_TABLET | Freq: Once | ORAL | Status: AC
  Administered 2024-02-11: 650 mg via ORAL
  Filled 2024-02-11: qty 2

## 2024-02-11 MED ORDER — ONDANSETRON HCL 4 MG/2ML IJ SOLN
4.0000 mg | Freq: Once | INTRAMUSCULAR | Status: AC
  Administered 2024-02-11: 4 mg via INTRAVENOUS
  Filled 2024-02-11: qty 2

## 2024-02-11 MED ORDER — POTASSIUM CHLORIDE CRYS ER 20 MEQ PO TBCR
40.0000 meq | EXTENDED_RELEASE_TABLET | Freq: Once | ORAL | Status: AC
  Administered 2024-02-12: 40 meq via ORAL
  Filled 2024-02-11: qty 2

## 2024-02-11 MED ORDER — SODIUM CHLORIDE 0.9 % IV SOLN
1.0000 g | Freq: Once | INTRAVENOUS | Status: AC
  Administered 2024-02-11: 1 g via INTRAVENOUS
  Filled 2024-02-11: qty 10

## 2024-02-11 MED ORDER — CEPHALEXIN 500 MG PO CAPS: 500.0000 mg | ORAL_CAPSULE | Freq: Three times a day (TID) | ORAL | 0 refills | Status: DC

## 2024-02-11 NOTE — ED Provider Notes (Signed)
-----------------------------------------   11:34 PM on 02/11/2024 -----------------------------------------  Blood pressure 113/60, pulse 87, temperature 100.1 F (37.8 C), temperature source Oral, resp. rate 18, SpO2 93%.  Assuming care from Dr. Derrill Kay.  In short, Holly Carter is a 67 y.o. female with a chief complaint of Weakness .  Refer to the original H&P for additional details.  The current plan of care is to reassess following IVF and abx for UTI.  ----------------------------------------- 12:56 AM on 02/12/2024 ----------------------------------------- Patient feeling better on reassessment and is appropriate for discharge home with outpatient follow-up.  She was counseled to return to the ED for new or worsening symptoms, patient agrees with plan.       Chesley Noon, MD 02/12/24 [phone]

## 2024-02-11 NOTE — Progress Notes (Signed)
 Urine came back positive for a UTI I am sending in an antibiotic to get this treated.  H pylori test was negative.

## 2024-02-11 NOTE — Telephone Encounter (Signed)
 Did all of this start after she started the Augmentin? Can we ask pt? I assume not because I just sent that medication a few hours ago today? But if yes then she needs to not take anymore and be seen asap.   Concerning for the blurred vision I would almost prefer she go to urgent care and be seen today with these symptoms.

## 2024-02-11 NOTE — Telephone Encounter (Signed)
 I spoke with Alexa pts granddaughter (DPR signed) Alexa notified as instructed and Alexa voiced understanding., pt had symptoms prior to starting the  Augmentin. Alexa said pt has been weak with H/A and dizziness. Blurred vision comes and goes. Alexa or her aunt will take pt to UC in [place] within next few minutes. Alexa said OK to cancel appt on 01/12/24 with Dr Ermalene Searing. UC & ED precautions given again and Alexa voiced understanding.Sending note to Hayden Pedro FNP.

## 2024-02-11 NOTE — Telephone Encounter (Signed)
 Patient granddaughter  Alexa not list on DPR interpreting for patient. NT regarded HIPAA restrictions during triage Chief Complaint: weakness, headaches, dizziness Symptoms: weakness, dizziness esp when sitting up or standing. Headaches worse on Monday/ Tuesday now eased off 6/10 . Poor po intake. Drinking "electrolytes". No strength. Sx comes and goes. Reports blurred vision at times. Started taking augmentin today for UTI .  Frequency: since 02/08/24 Pertinent Negatives: Patient denies chest pain no difficulty breathing no fever no weaknessN /T on either side of body or face . Does not have BP monitor to check BP Disposition: [] ED /[] Urgent Care (no appt availability in office) / [x] Appointment(In office/virtual)/ []  [place] [facility]/ [] Home Care/ [] Refused Recommended Disposition /[] [place] [facility]/ []  Follow-up with PCP Additional Notes:   Appt scheduled for tomorrow with other provider than PCP, patient unable to come to appt today . No available appt with PCP until next week.  Recommended if sx worsen go to ED.     Reason for Disposition  [1] MODERATE headache (e.g., interferes with normal activities) AND [2] present > 24 hours AND [3] unexplained  (Exceptions: analgesics not tried, typical migraine, or headache part of viral illness)  Answer Assessment - Initial Assessment Questions 1. LOCATION: "Where does it hurt?"      Frontal and back of head per patient granddaughter Alexa, interpreting for patient not on DPR. 2. ONSET: "When did the headache start?" (Minutes, hours or days)      Monday / Tuesday  3. PATTERN: "Does the pain come and go, or has it been constant since it started?"     Comes and goes  4. SEVERITY: "How bad is the pain?" and "What does it keep you from doing?"  (e.g., Scale 1-10; mild, moderate, or severe)   - MILD (1-3): doesn't interfere with normal activities    - MODERATE (4-7): interferes with normal activities or awakens from sleep    - SEVERE  (8-10): excruciating pain, unable to do any normal activities        6/10 now  5. RECURRENT SYMPTOM: "Have you ever had headaches before?" If Yes, ask: "When was the last time?" and "What happened that time?"      Na  6. CAUSE: "What do you think is causing the headache?"     Not sure  7. MIGRAINE: "Have you been diagnosed with migraine headaches?" If Yes, ask: "Is this headache similar?"      na 8. HEAD INJURY: "Has there been any recent injury to the head?"      na 9. OTHER SYMPTOMS: "Do you have any other symptoms?" (fever, stiff neck, eye pain, sore throat, cold symptoms)     Headache dizziness, weakness standing some blurred vision comes and goes. Sitting up causes dizziness, just started augmentin 10. PREGNANCY: "Is there any chance you are pregnant?" "When was your last menstrual period?"       na  Protocols used: [facility]

## 2024-02-11 NOTE — ED Triage Notes (Signed)
 Pt to ED via POV from home. Pt reports body aches, weakness, HA and N/V since Monday. Pt also reports intermittent blurry vision since Monday. Pt reports hx of migraine.

## 2024-02-11 NOTE — ED Provider Notes (Signed)
 [facility] Provider Note    Event Date/Time   First MD Initiated Contact with Patient 02/11/24 2035     (approximate)   History   Weakness   HPI  Holly Carter is a 68 y.o. female who presents to the emergency department today because of concerns for weakness, nausea vomiting and some headache.  Patient states that symptoms have been ongoing for the past few days.  She has not been able to tolerate any significant p.o.  She denies any blood in her vomit.  Denies any associated abdominal pain.  Has not had any diarrhea.  Patient denies any fevers although has had some chills.  Denies any change in urination.     Physical Exam   Triage Vital Signs: ED Triage Vitals  Encounter Vitals Group     BP 02/11/24 1700 115/73     Systolic BP Percentile --      Diastolic BP Percentile --      Pulse Rate 02/11/24 1658 100     Resp 02/11/24 1658 20     Temp 02/11/24 1658 100.1 F (37.8 C)     Temp Source 02/11/24 1658 Oral     SpO2 02/11/24 1658 95 %     Weight --      Height --      Head Circumference --      Peak Flow --      Pain Score 02/11/24 1659 10     Pain Loc --      Pain Education --      Exclude from Growth Chart --     Most recent vital signs: Vitals:   02/11/24 1658 02/11/24 1700  BP:  115/73  Pulse: 100   Resp: 20   Temp: 100.1 F (37.8 C)   SpO2: 95%    General: Awake, alert, oriented. CV:  Good peripheral perfusion. Regular rate and rhythm. Resp:  Normal effort. Lungs clear. Abd:  No distention. Non tender.   ED Results / Procedures / Treatments   Labs (all labs ordered are listed, but only abnormal results are displayed) Labs Reviewed  BASIC METABOLIC PANEL WITH GFR - Abnormal; Notable for the following components:      Result Value   Sodium 132 (*)    Potassium 3.0 (*)    CO2 21 (*)    Glucose, Bld 154 (*)    Calcium 8.5 (*)    All other components within normal limits  CBC - Abnormal; Notable for the  following components:   Platelets 74 (*)    All other components within normal limits  URINALYSIS, ROUTINE W REFLEX MICROSCOPIC - Abnormal; Notable for the following components:   Color, Urine AMBER (*)    APPearance CLOUDY (*)    Hgb urine dipstick MODERATE (*)    Protein, ur 100 (*)    Nitrite POSITIVE (*)    Leukocytes,Ua MODERATE (*)    Bacteria, UA RARE (*)    All other components within normal limits  RESP PANEL BY RT-PCR (RSV, FLU A&B, COVID)  RVPGX2  CBG MONITORING, ED     EKG  I, Phineas Semen, attending physician, personally viewed and interpreted this EKG  EKG Time: 1705 Rate: 102 Rhythm: sinus tachycardia Axis: right axis deviation Intervals: qtc 448 QRS: narrow ST changes: no st elevation Impression: abnormal ekg   RADIOLOGY I independently interpreted and visualized the CT head. My interpretation: No bleed Radiology interpretation:  IMPRESSION:  No acute intracranial abnormality.  PROCEDURES:  Critical Care performed: No    MEDICATIONS ORDERED IN ED: Medications - No data to display   IMPRESSION / MDM / ASSESSMENT AND PLAN / ED COURSE  I reviewed the triage vital signs and the nursing notes.                              Differential diagnosis includes, but is not limited to, dehydration, electrolyte abnormality, UTI, viral infection  Patient's presentation is most consistent with acute presentation with potential threat to life or bodily function.   Patient presented to the emergency department today because of concerns for weakness, nausea vomiting and headache.  On exam patient is awake and alert.  Abdomen is nontender.  Blood work without concerning leukocytosis.  UA however is concerning for infection.  I did discuss this with the patient.  Will plan on giving IV fluids, antiemetics and antibiotics. Do think if patient feels better after IV fluids it would be reasonable for patient to be discharged.     FINAL CLINICAL  IMPRESSION(S) / ED DIAGNOSES   Final diagnoses:  Weakness  Lower urinary tract infectious disease     Note:  This document was prepared using Dragon voice recognition software and may include unintentional dictation errors.    Phineas Semen, MD 02/11/24 [phone]

## 2024-02-12 ENCOUNTER — Ambulatory Visit: Admitting: Family Medicine

## 2024-02-12 DIAGNOSIS — R531 Weakness: Secondary | ICD-10-CM | POA: Diagnosis not present

## 2024-02-12 MED ORDER — CEPHALEXIN 500 MG PO CAPS: 500.0000 mg | ORAL_CAPSULE | Freq: Three times a day (TID) | ORAL | 0 refills | Status: DC

## 2024-02-12 NOTE — Telephone Encounter (Signed)
 NOTED

## 2024-02-12 NOTE — ED Notes (Signed)
 D/C instructions reviewed with patient using spanish ipad interpreter. ..Marland KitchenMarland KitchenThe patient is A&OX4, wheeled out of ED via wheelchair, NAD. Pt verbalized understanding of d/c instructions, prescription and follow up care.

## 2024-02-15 ENCOUNTER — Ambulatory Visit
Admission: RE | Admit: 2024-02-15 | Discharge: 2024-02-15 | Disposition: A | Discharge status: Home / Self Care | Source: Ambulatory Visit | Attending: Family | Admitting: Family

## 2024-02-15 DIAGNOSIS — R160 Hepatomegaly, not elsewhere classified: Secondary | ICD-10-CM | POA: Diagnosis not present

## 2024-02-15 DIAGNOSIS — K769 Liver disease, unspecified: Secondary | ICD-10-CM | POA: Insufficient documentation

## 2024-02-15 DIAGNOSIS — R1010 Upper abdominal pain, unspecified: Secondary | ICD-10-CM | POA: Insufficient documentation

## 2024-02-16 ENCOUNTER — Inpatient Hospital Stay: Attending: Oncology | Admitting: Oncology

## 2024-02-16 ENCOUNTER — Inpatient Hospital Stay

## 2024-02-16 ENCOUNTER — Other Ambulatory Visit: Payer: Self-pay | Admitting: *Deleted

## 2024-02-16 ENCOUNTER — Encounter: Payer: Self-pay | Admitting: Oncology

## 2024-02-16 VITALS — BP 128/83 | HR 73 | Temp 98.2°F | Resp 16 | Ht 58.5 in | Wt 154.6 lb

## 2024-02-16 DIAGNOSIS — Z79899 Other long term (current) drug therapy: Secondary | ICD-10-CM | POA: Insufficient documentation

## 2024-02-16 DIAGNOSIS — R779 Abnormality of plasma protein, unspecified: Secondary | ICD-10-CM | POA: Diagnosis not present

## 2024-02-16 DIAGNOSIS — R5383 Other fatigue: Secondary | ICD-10-CM

## 2024-02-16 DIAGNOSIS — Z862 Personal history of diseases of the blood and blood-forming organs and certain disorders involving the immune mechanism: Secondary | ICD-10-CM | POA: Diagnosis not present

## 2024-02-16 DIAGNOSIS — Z803 Family history of malignant neoplasm of breast: Secondary | ICD-10-CM | POA: Diagnosis not present

## 2024-02-16 DIAGNOSIS — R531 Weakness: Secondary | ICD-10-CM

## 2024-02-16 DIAGNOSIS — Z79620 Long term (current) use of immunosuppressive biologic: Secondary | ICD-10-CM | POA: Diagnosis not present

## 2024-02-16 DIAGNOSIS — D72819 Decreased white blood cell count, unspecified: Secondary | ICD-10-CM

## 2024-02-16 DIAGNOSIS — D696 Thrombocytopenia, unspecified: Secondary | ICD-10-CM

## 2024-02-16 LAB — CMP (CANCER CENTER ONLY)
ALT: 50 U/L — ABNORMAL HIGH (ref 0–44)
AST: 35 U/L (ref 15–41)
Albumin: 3.2 g/dL — ABNORMAL LOW (ref 3.5–5.0)
Alkaline Phosphatase: 79 U/L (ref 38–126)
Anion gap: 9 (ref 5–15)
BUN: 16 mg/dL (ref 8–23)
CO2: 23 mmol/L (ref 22–32)
Calcium: 8.5 mg/dL — ABNORMAL LOW (ref 8.9–10.3)
Chloride: 105 mmol/L (ref 98–111)
Creatinine: 0.46 mg/dL (ref 0.44–1.00)
GFR, Estimated: >60 mL/min (ref >60)
Glucose, Bld: 125 mg/dL — ABNORMAL HIGH (ref 70–99)
Potassium: 4.1 mmol/L (ref 3.5–5.1)
Sodium: 137 mmol/L (ref 135–145)
Total Bilirubin: 0.5 mg/dL (ref 0.0–1.2)
Total Protein: 6.7 g/dL (ref 6.5–8.1)

## 2024-02-16 LAB — FERRITIN: Ferritin: 239 ng/mL (ref 11–307)

## 2024-02-16 LAB — CBC (CANCER CENTER ONLY)
HCT: 40.1 % (ref 36.0–46.0)
Hemoglobin: 13.2 g/dL (ref 12.0–15.0)
MCH: 32.6 pg (ref 26.0–34.0)
MCHC: 32.9 g/dL (ref 30.0–36.0)
MCV: 99 fL (ref 80.0–100.0)
Platelet Count: 170 10*3/uL (ref 150–400)
RBC: 4.05 MIL/uL (ref 3.87–5.11)
RDW: 13.8 % (ref 11.5–15.5)
WBC Count: 3 10*3/uL — ABNORMAL LOW (ref 4.0–10.5)
nRBC: 0 % (ref 0.0–0.2)

## 2024-02-16 LAB — IRON AND TIBC
Iron: 102 ug/dL (ref 28–170)
Saturation Ratios: 33 % — ABNORMAL HIGH (ref 10.4–31.8)
TIBC: 314 ug/dL (ref 250–450)
UIBC: 212 ug/dL

## 2024-02-16 LAB — FOLATE: Folate: 15.1 ng/mL (ref >5.9)

## 2024-02-16 LAB — VITAMIN B12: Vitamin B-12: 2580 pg/mL — ABNORMAL HIGH (ref 180–914)

## 2024-02-16 NOTE — Progress Notes (Signed)
 Patient was very weak when walking back. Has gotten worse over the past week.

## 2024-02-16 NOTE — Progress Notes (Signed)
 [facility]  Telephone:([phone]) [phone] Fax:([phone]) [phone]  ID: Avon Gully OB: [birth year]  MR#: [id number]  AOZ#:[id number]  Patient Care Team: Mort Sawyers, FNP as PCP - General (Family Medicine) Jeralyn Ruths, MD as Consulting Physician (Oncology)  CHIEF COMPLAINT: Leukopenia, thrombocytopenia.  INTERVAL HISTORY: Patient is a 68 year old female with chronic weakness and fatigue was also noted to have a persistently decreased total white blood cell count as well as thrombocytopenia.  She otherwise feels well.  She has no neurologic complaints.  She denies any recent fevers.  She was seen in the emergency room approximately 1 week ago and found to have UTI and is currently taking Augmentin.  She has no chest pain, shortness of breath, cough, or hemoptysis.  She denies any nausea, vomiting, constipation, or diarrhea.  She has no further urinary complaints.  Patient offers no further specific complaints today.  REVIEW OF SYSTEMS:   Review of Systems  Constitutional:  Positive for malaise/fatigue. Negative for fever and weight loss.  Respiratory: Negative.  Negative for cough, hemoptysis and shortness of breath.   Cardiovascular: Negative.  Negative for chest pain and leg swelling.  Gastrointestinal: Negative.  Negative for abdominal pain.  Genitourinary: Negative.  Negative for dysuria and hematuria.  Musculoskeletal: Negative.  Negative for back pain.  Skin: Negative.  Negative for rash.  Neurological:  Positive for weakness. Negative for dizziness, focal weakness and headaches.  Psychiatric/Behavioral: Negative.  The patient is not nervous/anxious.     As per HPI. Otherwise, a complete review of systems is negative.  PAST MEDICAL HISTORY: Past Medical History:  Diagnosis Date   Arthritis    CCC (chronic calculous cholecystitis)    Depression    DVT, lower extremity (HCC)    30 years ago in [place] after appendix was removed   History of kidney  stones    Hypertension    Pneumonia    Pre-diabetes     PAST SURGICAL HISTORY: Past Surgical History:  Procedure Laterality Date   APPENDECTOMY     in [place]   CHOLECYSTECTOMY  10/07/2023   lithotripsy with stent     TOTAL VAGINAL HYSTERECTOMY      FAMILY HISTORY: Family History  Problem Relation Age of Onset   Cancer Daughter        Breast cancer    ADVANCED DIRECTIVES (Y/N):  N  HEALTH MAINTENANCE: Social History   Tobacco Use   Smoking status: Never   Smokeless tobacco: Never  Vaping Use   Vaping status: Never Used  Substance Use Topics   Alcohol use: Not Currently   Drug use: Never     Colonoscopy:  PAP:  Bone density:  Lipid panel:  No Known Allergies  Current Outpatient Medications  Medication Sig Dispense Refill   amoxicillin-clavulanate (AUGMENTIN) 875-125 MG tablet Take 1 tablet by mouth 2 (two) times daily. 20 tablet 0   cephALEXin (KEFLEX) 500 MG capsule Take 1 capsule (500 mg total) by mouth 3 (three) times daily. 30 capsule 0   hydrOXYzine (ATARAX) 25 MG tablet Take 0.5-1 tablets (12.5-25 mg total) by mouth daily as needed. 30 tablet 0   omeprazole (PRILOSEC) 20 MG capsule Take 1 capsule (20 mg total) by mouth daily. 90 capsule 0   sertraline (ZOLOFT) 100 MG tablet Take 1 tablet (100 mg total) by mouth daily. 90 tablet 0   No current facility-administered medications for this visit.    OBJECTIVE: Vitals:   02/16/24 1120  BP: 128/83  Pulse: 73  Resp: 16  Temp: 98.2 F (36.8 C)  SpO2: 100%     Body mass index is 31.76 kg/m.    ECOG FS:1 - Symptomatic but completely ambulatory  General: Well-developed, well-nourished, no acute distress. Eyes: Pink conjunctiva, anicteric sclera. HEENT: Normocephalic, moist mucous membranes. Lungs: No audible wheezing or coughing. Heart: Regular rate and rhythm. Abdomen: Soft, nontender, no obvious distention. Musculoskeletal: No edema, cyanosis, or clubbing. Neuro: Alert, answering all questions  appropriately. Cranial nerves grossly intact. Skin: No rashes or petechiae noted. Psych: Normal affect. Lymphatics: No cervical, calvicular, axillary or inguinal LAD.   LAB RESULTS:  Lab Results  Component Value Date   NA 132 (L) 02/11/2024   K 3.0 (L) 02/11/2024   CL 99 02/11/2024   CO2 21 (L) 02/11/2024   GLUCOSE 154 (H) 02/11/2024   BUN 13 02/11/2024   CREATININE 0.53 02/11/2024   CALCIUM 8.5 (L) 02/11/2024   PROT 6.5 02/08/2024   ALBUMIN 4.2 02/08/2024   AST 19 02/08/2024   ALT 29 02/08/2024   ALKPHOS 88 02/08/2024   BILITOT 0.4 02/08/2024   GFRNONAA >60 02/11/2024   GFRAA >60 12/08/2017    Lab Results  Component Value Date   WBC 3.0 (L) 02/16/2024   NEUTROABS 2.5 02/08/2024   HGB 13.2 02/16/2024   HCT 40.1 02/16/2024   MCV 99.0 02/16/2024   PLT 170 02/16/2024     STUDIES: US Abdomen Complete Result Date: 02/15/2024 CLINICAL DATA:  Upper abdomen pain.  Liver lesion. EXAM: ABDOMEN ULTRASOUND COMPLETE COMPARISON:  Limited abdomen ultrasound October 06, 2023 FINDINGS: Gallbladder: Prior cholecystectomy. Common bile duct: Diameter: 5.8 mm. Liver: No focal lesion identified. The previously noted mass in the liver is not documented on the current exam. Within normal limits in parenchymal echogenicity. Portal vein is patent on color Doppler imaging with normal direction of blood flow towards the liver. IVC: No abnormality visualized. Pancreas: Visualized portion unremarkable. Spleen: Size and appearance within normal limits. Right Kidney: Length: 11.6 cm. Echogenicity within normal limits. No mass or hydronephrosis visualized. Left Kidney: Length: 12.2 cm. Echogenicity within normal limits. No mass or hydronephrosis visualized. Abdominal aorta: No aneurysm visualized. Other findings: None. IMPRESSION: 1. No acute abnormality identified. 2. The previously noted mass in the liver is not documented on the current exam. Electronically Signed   By: Sherian Rein M.D.   On: 02/15/2024  11:21   CT HEAD WO CONTRAST Result Date: 02/11/2024 CLINICAL DATA:  blurry vision EXAM: CT HEAD WITHOUT CONTRAST TECHNIQUE: Contiguous axial images were obtained from the base of the skull through the vertex without intravenous contrast. RADIATION DOSE REDUCTION: This exam was performed according to the departmental dose-optimization program which includes automated exposure control, adjustment of the mA and/or kV according to patient size and/or use of iterative reconstruction technique. COMPARISON:  CT head 03/16/2009 FINDINGS: Brain: No evidence of large-territorial acute infarction. No parenchymal hemorrhage. No mass lesion. No extra-axial collection. No mass effect or midline shift. No hydrocephalus. Basilar cisterns are patent. Vascular: No hyperdense vessel. Skull: No acute fracture or focal lesion. Sinuses/Orbits: Paranasal sinuses and mastoid air cells are clear. The orbits are unremarkable. Other: None. IMPRESSION: No acute intracranial abnormality. Electronically Signed   By: Tish Frederickson M.D.   On: 02/11/2024 20:03    ASSESSMENT:   PLAN:    Leukopenia: Upon review of patient's records it appears she has had intermittent leukopenia since at least September 2018 ranging from 2.3-5.1.  Total white blood cell count from today is 3.0.  All of her other laboratory  work from today is pending at time of dictation.  No intervention is needed.  Patient does not require bone marrow biopsy.  Return to clinic in 3 weeks for further evaluation and discussion of her laboratory results. Thrombocytopenia: Resolved.  Patient's platelet count is 170 today. UTI: Patient is no longer symptomatic.  She has been instructed to complete Augmentin as prescribed. Weakness and fatigue: Unclear etiology, but likely multifactorial.  Unrelated to underlying leukopenia.  Monitor.  I spent a total of 45 minutes reviewing chart data, face-to-face evaluation with the patient, counseling and coordination of care as  detailed above.    Patient expressed understanding and was in agreement with this plan. She also understands that She can call clinic at any time with any questions, concerns, or complaints.    Shellie Dials, MD   02/16/2024 12:44 PM

## 2024-02-17 LAB — PROTEIN ELECTROPHORESIS, SERUM
A/G Ratio: 0.9 (ref 0.7–1.7)
Albumin ELP: 3 g/dL (ref 2.9–4.4)
Alpha-1-Globulin: 0.4 g/dL (ref 0.0–0.4)
Alpha-2-Globulin: 0.8 g/dL (ref 0.4–1.0)
Beta Globulin: 0.9 g/dL (ref 0.7–1.3)
Gamma Globulin: 1.1 g/dL (ref 0.4–1.8)
Globulin, Total: 3.2 g/dL (ref 2.2–3.9)
M-Spike, %: 0.3 g/dL — ABNORMAL HIGH
Total Protein ELP: 6.2 g/dL (ref 6.0–8.5)

## 2024-02-17 LAB — PLATELET ANTIBODY PROFILE
Glycoprotein IV Antibody: NEGATIVE
HLA Ab Ser Ql EIA: NEGATIVE
IA/IIA Antibody: NEGATIVE
IB/IX Antibody: NEGATIVE
IIB/IIIA Antibody: NEGATIVE

## 2024-02-17 LAB — THYROID PANEL WITH TSH
Free Thyroxine Index: 2.6 (ref 1.2–4.9)
T3 Uptake Ratio: 32 % (ref 24–39)
T4, Total: 8 ug/dL (ref 4.5–12.0)
TSH: 1.49 u[IU]/mL (ref 0.450–4.500)

## 2024-02-18 LAB — COMP PANEL: LEUKEMIA/LYMPHOMA

## 2024-02-23 LAB — NEUTROPHIL AB TEST LEVEL 1: NEUTROPHIL SCR/PANEL RESULT: NEGATIVE

## 2024-02-29 ENCOUNTER — Other Ambulatory Visit (INDEPENDENT_AMBULATORY_CARE_PROVIDER_SITE_OTHER): Payer: Self-pay | Admitting: Nurse Practitioner

## 2024-02-29 DIAGNOSIS — M79604 Pain in right leg: Secondary | ICD-10-CM

## 2024-02-29 DIAGNOSIS — R200 Anesthesia of skin: Secondary | ICD-10-CM

## 2024-03-02 ENCOUNTER — Encounter (INDEPENDENT_AMBULATORY_CARE_PROVIDER_SITE_OTHER): Payer: Self-pay | Admitting: Nurse Practitioner

## 2024-03-02 ENCOUNTER — Ambulatory Visit (INDEPENDENT_AMBULATORY_CARE_PROVIDER_SITE_OTHER): Payer: Self-pay

## 2024-03-02 ENCOUNTER — Ambulatory Visit (INDEPENDENT_AMBULATORY_CARE_PROVIDER_SITE_OTHER): Payer: Self-pay | Admitting: Nurse Practitioner

## 2024-03-02 VITALS — BP 110/73 | HR 70 | Resp 16 | Wt 159.4 lb

## 2024-03-02 DIAGNOSIS — M79605 Pain in left leg: Secondary | ICD-10-CM

## 2024-03-02 DIAGNOSIS — R29898 Other symptoms and signs involving the musculoskeletal system: Secondary | ICD-10-CM

## 2024-03-02 DIAGNOSIS — R202 Paresthesia of skin: Secondary | ICD-10-CM | POA: Diagnosis not present

## 2024-03-02 DIAGNOSIS — R200 Anesthesia of skin: Secondary | ICD-10-CM

## 2024-03-02 DIAGNOSIS — M79604 Pain in right leg: Secondary | ICD-10-CM | POA: Diagnosis not present

## 2024-03-02 DIAGNOSIS — R600 Localized edema: Secondary | ICD-10-CM

## 2024-03-02 NOTE — Progress Notes (Signed)
 Subjective:    Patient ID: Holly Carter, female    DOB: 11-22-1955, 68 y.o.   MRN: [id number] Chief Complaint  Patient presents with   New Patient (Initial Visit)    Ref [facility] consult bilateral leg pain, numbness, and tingling    The patient is a 68 year old female who presents today as a referral from Barnett Booty, NP in regards to numbness and tingling of her bilateral lower extremities.  She notes the numbness and tingling has been going on for approximately 3 years.  She also endorses having in her arms and her legs.  She does note that she has swelling in her extremities but it tends to happen more during the cold season and it tends to affect her joint such as her elbows and her knees.  She endorses having weakness of her legs but that really symptoms around weakness in her knee area.  She notes that in [place] she had history of clots in her right leg.  She also notes that sometimes with standing her legs do become red and discolored and she has notable varicosities bilaterally.  She denies classic claudication symptoms or rest pain.  Currently there are no open wounds or ulcerations.  Today the patient has an ABI of 1.14 on the right and 1.17 on the left.  She has strong triphasic waveforms bilaterally with good toe waveforms.    Review of Systems  Cardiovascular:  Positive for leg swelling.  Musculoskeletal:  Positive for arthralgias.  Neurological:  Positive for weakness and numbness.  All other systems reviewed and are negative.      Objective:   Physical Exam Vitals reviewed.  HENT:     Head: Normocephalic.  Cardiovascular:     Rate and Rhythm: Normal rate.     Pulses: Normal pulses.  Pulmonary:     Effort: Pulmonary effort is normal.  Musculoskeletal:     Right lower leg: No edema.     Left lower leg: No edema.  Skin:    General: Skin is warm and dry.  Neurological:     Mental Status: She is alert and oriented to person, place, and time.  Psychiatric:         Mood and Affect: Mood normal.        Behavior: Behavior normal.        Thought Content: Thought content normal.        Judgment: Judgment normal.     BP 110/73   Pulse 70   Resp 16   Wt 159 lb 6.4 oz (72.3 kg)   BMI 32.75 kg/m   Past Medical History:  Diagnosis Date   Arthritis    CCC (chronic calculous cholecystitis)    Depression    DVT, lower extremity (HCC)    30 years ago in [place] after appendix was removed   History of kidney stones    Hypertension    Pneumonia    Pre-diabetes     Social History   Socioeconomic History   Marital status: Married    Spouse name: Not on file   Number of children: 6   Years of education: Not on file   Highest education level: Not on file  Occupational History   Occupation: retired  Tobacco Use   Smoking status: Never   Smokeless tobacco: Never  Vaping Use   Vaping status: Never Used  Substance and Sexual Activity   Alcohol use: Not Currently   Drug use: Never   Sexual activity: Yes  Partners: Male  Other Topics Concern   Not on file  Social History Narrative   Lives with daughter   Social Drivers of Health   Financial Resource Strain: Low Risk  (09/24/2023)   Received from [facility]   Overall Financial Resource Strain (CARDIA)    Difficulty of Paying Living Expenses: Not very hard  Food Insecurity: No Food Insecurity (10/08/2023)   Hunger Vital Sign    Worried About Running Out of Food in the Last Year: Never true    Ran Out of Food in the Last Year: Never true  Transportation Needs: No Transportation Needs (10/08/2023)   PRAPARE - Administrator, Civil Service (Medical): No    Lack of Transportation (Non-Medical): No  Physical Activity: Not on file  Stress: Not on file  Social Connections: Not on file  Intimate Partner Violence: Not At Risk (10/08/2023)   Humiliation, Afraid, Rape, and Kick questionnaire    Fear of Current or Ex-Partner: No    Emotionally Abused: No     Physically Abused: No    Sexually Abused: No    Past Surgical History:  Procedure Laterality Date   APPENDECTOMY     in [place]   CHOLECYSTECTOMY  10/07/2023   lithotripsy with stent     TOTAL VAGINAL HYSTERECTOMY      Family History  Problem Relation Age of Onset   Cancer Daughter        Breast cancer    No Known Allergies     Latest Ref Rng & Units 02/16/2024   12:20 PM 02/11/2024    5:01 PM 02/08/2024    8:44 AM  CBC  WBC 4.0 - 10.5 K/uL 3.0  4.4  3.7   Hemoglobin 12.0 - 15.0 g/dL 16.1  09.6  04.5   Hematocrit 36.0 - 46.0 % 40.1  41.8  41.1   Platelets 150 - 400 K/uL 170  74  131.0       CMP     Component Value Date/Time   NA 137 02/16/2024 1236   K 4.1 02/16/2024 1236   CL 105 02/16/2024 1236   CO2 23 02/16/2024 1236   GLUCOSE 125 (H) 02/16/2024 1236   BUN 16 02/16/2024 1236   CREATININE 0.46 02/16/2024 1236   CALCIUM 8.5 (L) 02/16/2024 1236   PROT 6.7 02/16/2024 1236   ALBUMIN 3.2 (L) 02/16/2024 1236   AST 35 02/16/2024 1236   ALT 50 (H) 02/16/2024 1236   ALKPHOS 79 02/16/2024 1236   BILITOT 0.5 02/16/2024 1236   GFR 96.12 02/08/2024 0844   GFRNONAA >60 02/16/2024 1236     No results found.     Assessment & Plan:   1. Numbness and tingling of both lower extremities (Primary) Today patient's noninvasive study showed no significant evidence of arterial abnormality.  Based on the patient's description of numbness and tingling I suspect this is more related to neuropathy, especially because she describes in her arms as well.  Will refer the patient to neurology for further workup and evaluation.  No further intervention necessary from a vascular standpoint.  2. Pedal edema There is documented pedal edema however the patient reports more arthralgias and swelling near her joint spaces.  She complains more about the varicosities that are present on her lower extremities.  We have discussed the use of medical grade compression stockings as well as some  troubleshooting with the ones that she currently has.  We also discussed elevating her lower extremities and activity  as tolerable.  She will return for bilateral venous reflux study.  3. Bilateral leg weakness Following patient's description of weakness, she does not have claudication or rest pain like symptoms but I suspect these are more related to arthralgias as the patient described as more centered around her knees during her description.  Will defer to PCP for further workup.   Current Outpatient Medications on File Prior to Visit  Medication Sig Dispense Refill   hydrOXYzine  (ATARAX ) 25 MG tablet Take 0.5-1 tablets (12.5-25 mg total) by mouth daily as needed. 30 tablet 0   omeprazole  (PRILOSEC) 20 MG capsule Take 1 capsule (20 mg total) by mouth daily. 90 capsule 0   sertraline  (ZOLOFT ) 100 MG tablet Take 1 tablet (100 mg total) by mouth daily. 90 tablet 0   amoxicillin -clavulanate (AUGMENTIN ) 875-125 MG tablet Take 1 tablet by mouth 2 (two) times daily. (Patient not taking: Reported on 03/02/2024) 20 tablet 0   cephALEXin  (KEFLEX ) 500 MG capsule Take 1 capsule (500 mg total) by mouth 3 (three) times daily. (Patient not taking: Reported on 03/02/2024) 30 capsule 0   No current facility-administered medications on file prior to visit.    There are no Patient Instructions on file for this visit. No follow-ups on file.   Trejan Buda E Ysidro Ramsay, NP

## 2024-03-07 LAB — INTELLIGEN MYELOID

## 2024-03-11 ENCOUNTER — Emergency Department

## 2024-03-11 ENCOUNTER — Encounter: Payer: Self-pay | Admitting: Oncology

## 2024-03-11 ENCOUNTER — Other Ambulatory Visit: Payer: Self-pay

## 2024-03-11 ENCOUNTER — Emergency Department
Admission: EM | Admit: 2024-03-11 | Discharge: 2024-03-12 | Discharge status: Against Medical Advice | Attending: Emergency Medicine | Admitting: Emergency Medicine

## 2024-03-11 ENCOUNTER — Inpatient Hospital Stay: Attending: Oncology | Admitting: Oncology

## 2024-03-11 VITALS — BP 157/95 | HR 70 | Temp 97.7°F | Resp 18 | Wt 160.0 lb

## 2024-03-11 DIAGNOSIS — D472 Monoclonal gammopathy: Secondary | ICD-10-CM | POA: Diagnosis not present

## 2024-03-11 DIAGNOSIS — Z803 Family history of malignant neoplasm of breast: Secondary | ICD-10-CM | POA: Insufficient documentation

## 2024-03-11 DIAGNOSIS — R519 Headache, unspecified: Secondary | ICD-10-CM | POA: Insufficient documentation

## 2024-03-11 DIAGNOSIS — Z5321 Procedure and treatment not carried out due to patient leaving prior to being seen by health care provider: Secondary | ICD-10-CM | POA: Insufficient documentation

## 2024-03-11 DIAGNOSIS — R079 Chest pain, unspecified: Secondary | ICD-10-CM | POA: Insufficient documentation

## 2024-03-11 DIAGNOSIS — R350 Frequency of micturition: Secondary | ICD-10-CM | POA: Diagnosis not present

## 2024-03-11 DIAGNOSIS — R42 Dizziness and giddiness: Secondary | ICD-10-CM | POA: Insufficient documentation

## 2024-03-11 DIAGNOSIS — Z862 Personal history of diseases of the blood and blood-forming organs and certain disorders involving the immune mechanism: Secondary | ICD-10-CM | POA: Insufficient documentation

## 2024-03-11 DIAGNOSIS — D696 Thrombocytopenia, unspecified: Secondary | ICD-10-CM

## 2024-03-11 DIAGNOSIS — D72819 Decreased white blood cell count, unspecified: Secondary | ICD-10-CM | POA: Insufficient documentation

## 2024-03-11 DIAGNOSIS — H538 Other visual disturbances: Secondary | ICD-10-CM | POA: Insufficient documentation

## 2024-03-11 DIAGNOSIS — Z79899 Other long term (current) drug therapy: Secondary | ICD-10-CM | POA: Diagnosis not present

## 2024-03-11 DIAGNOSIS — R531 Weakness: Secondary | ICD-10-CM | POA: Insufficient documentation

## 2024-03-11 NOTE — ED Notes (Signed)
 Called for treatment room.

## 2024-03-11 NOTE — Progress Notes (Signed)
 [facility]  [phone] [phone]  ID: Holly Carter OB: 1956/02/11  MR#: [id number]  AOZ#:[id number]  Patient Care Team: Felicita Horns, FNP as PCP - General (Family Medicine) Shellie Dials, MD as Consulting Physician (Oncology)  CHIEF COMPLAINT: Leukopenia, thrombocytopenia.  INTERVAL HISTORY: Patient returns to clinic today for further evaluation and discussion of her laboratory work.  Over the past several weeks she has had increased dizziness, visual changes, multiple falls, and headache.  She continues to have persistent weakness and fatigue as well.   She denies any recent fevers.  She has a fair appetite, but denies weight loss.  She has no chest pain, shortness of breath, cough, or hemoptysis.  She denies any nausea, vomiting, constipation, or diarrhea.  She has no urinary complaints.  Patient offers no further specific complaints today.   REVIEW OF SYSTEMS:   Review of Systems  Constitutional:  Positive for malaise/fatigue. Negative for fever and weight loss.  Eyes:  Positive for blurred vision.  Respiratory: Negative.  Negative for cough, hemoptysis and shortness of breath.   Cardiovascular: Negative.  Negative for chest pain and leg swelling.  Gastrointestinal: Negative.  Negative for abdominal pain.  Genitourinary: Negative.  Negative for dysuria and hematuria.  Musculoskeletal:  Positive for falls. Negative for back pain.  Skin: Negative.  Negative for rash.  Neurological:  Positive for dizziness, weakness and headaches. Negative for focal weakness.  Psychiatric/Behavioral: Negative.  The patient is not nervous/anxious.     As per HPI. Otherwise, a complete review of systems is negative.  PAST MEDICAL HISTORY: Past Medical History:  Diagnosis Date   Arthritis    CCC (chronic calculous cholecystitis)    Depression    DVT, lower extremity (HCC)    30 years ago in [place] after appendix was removed   History of  kidney stones    Hypertension    Pneumonia    Pre-diabetes     PAST SURGICAL HISTORY: Past Surgical History:  Procedure Laterality Date   APPENDECTOMY     in [place]   CHOLECYSTECTOMY  10/07/2023   lithotripsy with stent     TOTAL VAGINAL HYSTERECTOMY      FAMILY HISTORY: Family History  Problem Relation Age of Onset   Cancer Daughter        Breast cancer    ADVANCED DIRECTIVES (Y/N):  N  HEALTH MAINTENANCE: Social History   Tobacco Use   Smoking status: Never   Smokeless tobacco: Never  Vaping Use   Vaping status: Never Used  Substance Use Topics   Alcohol use: Not Currently   Drug use: Never     Colonoscopy:  PAP:  Bone density:  Lipid panel:  No Known Allergies  Current Outpatient Medications  Medication Sig Dispense Refill   hydrOXYzine  (ATARAX ) 25 MG tablet Take 0.5-1 tablets (12.5-25 mg total) by mouth daily as needed. 30 tablet 0   omeprazole  (PRILOSEC) 20 MG capsule Take 1 capsule (20 mg total) by mouth daily. 90 capsule 0   sertraline  (ZOLOFT ) 100 MG tablet Take 1 tablet (100 mg total) by mouth daily. 90 tablet 0   amoxicillin -clavulanate (AUGMENTIN ) 875-125 MG tablet Take 1 tablet by mouth 2 (two) times daily. (Patient not taking: Reported on 03/02/2024) 20 tablet 0   cephALEXin  (KEFLEX ) 500 MG capsule Take 1 capsule (500 mg total) by mouth 3 (three) times daily. (Patient not taking: Reported on 03/02/2024) 30 capsule 0   No current facility-administered medications for this visit.    OBJECTIVE:  Vitals:   03/11/24 0940  BP: (!) 157/95  Pulse: 70  Resp: 18  Temp: 97.7 F (36.5 C)  SpO2: 98%     Body mass index is 32.87 kg/m.    ECOG FS:2 - Symptomatic, <50% confined to bed  General: Well-developed, well-nourished, no acute distress.  Sitting in a wheelchair. Eyes: Pink conjunctiva, anicteric sclera. HEENT: Normocephalic, moist mucous membranes. Lungs: No audible wheezing or coughing. Heart: Regular rate and rhythm. Abdomen: Soft,  nontender, no obvious distention. Musculoskeletal: No edema, cyanosis, or clubbing. Neuro: Alert, answering all questions appropriately. Cranial nerves grossly intact. Skin: No rashes or petechiae noted. Psych: Normal affect.  LAB RESULTS:  Lab Results  Component Value Date   NA 137 02/16/2024   K 4.1 02/16/2024   CL 105 02/16/2024   CO2 23 02/16/2024   GLUCOSE 125 (H) 02/16/2024   BUN 16 02/16/2024   CREATININE 0.46 02/16/2024   CALCIUM 8.5 (L) 02/16/2024   PROT 6.7 02/16/2024   ALBUMIN 3.2 (L) 02/16/2024   AST 35 02/16/2024   ALT 50 (H) 02/16/2024   ALKPHOS 79 02/16/2024   BILITOT 0.5 02/16/2024   GFRNONAA >60 02/16/2024   GFRAA >60 12/08/2017    Lab Results  Component Value Date   WBC 3.0 (L) 02/16/2024   NEUTROABS 2.5 02/08/2024   HGB 13.2 02/16/2024   HCT 40.1 02/16/2024   MCV 99.0 02/16/2024   PLT 170 02/16/2024     STUDIES: VAS US  ABI WITH/WO TBI Result Date: 03/03/2024  LOWER EXTREMITY DOPPLER STUDY Patient Name:  Domineque Burgi  Date of Exam:   03/02/2024 Medical Rec #: [id number]             Accession #:    [id number] Date of Birth: Jan 31, 1956            Patient Gender: F Patient Age:   68 years Exam Location:  [facility] Procedure:      VAS US  ABI WITH/WO TBI Referring Phys: --------------------------------------------------------------------------------  Indications: Rest pain.  Performing Technologist: Faustine Hoof RVT  Examination Guidelines: A complete evaluation includes at minimum, Doppler waveform signals and systolic blood pressure reading at the level of bilateral brachial, anterior tibial, and posterior tibial arteries, when vessel segments are accessible. Bilateral testing is considered an integral part of a complete examination. Photoelectric Plethysmograph (PPG) waveforms and toe systolic pressure readings are included as required and additional duplex testing as needed. Limited examinations for reoccurring indications may be performed  as noted.  ABI Findings: +---------+------------------+-----+---------+--------+ Right    Rt Pressure (mmHg)IndexWaveform Comment  +---------+------------------+-----+---------+--------+ Brachial 132                                      +---------+------------------+-----+---------+--------+ PTA      150               1.14 triphasic         +---------+------------------+-----+---------+--------+ DP       148               1.12 triphasic         +---------+------------------+-----+---------+--------+ Great Toe148               1.12 Normal            +---------+------------------+-----+---------+--------+ +---------+------------------+-----+---------+-------+ Left     Lt Pressure (mmHg)IndexWaveform Comment +---------+------------------+-----+---------+-------+ Brachial 130                                     +---------+------------------+-----+---------+-------+  PTA      154               1.17 triphasic        +---------+------------------+-----+---------+-------+ DP       149               1.13 triphasic        +---------+------------------+-----+---------+-------+ Great Toe152               1.15 Normal           +---------+------------------+-----+---------+-------+  Summary: Right: Resting right ankle-brachial index is within normal range. The right toe-brachial index is normal. Left: Resting left ankle-brachial index is within normal range. The left toe-brachial index is normal. *See table(s) above for measurements and observations.  Electronically signed by Mikki Alexander MD on 03/03/2024 at 3:53:56 PM.    Final    US  Abdomen Complete Result Date: 02/15/2024 CLINICAL DATA:  Upper abdomen pain.  Liver lesion. EXAM: ABDOMEN ULTRASOUND COMPLETE COMPARISON:  Limited abdomen ultrasound October 06, 2023 FINDINGS: Gallbladder: Prior cholecystectomy. Common bile duct: Diameter: 5.8 mm. Liver: No focal lesion identified. The previously noted mass in the liver is not  documented on the current exam. Within normal limits in parenchymal echogenicity. Portal vein is patent on color Doppler imaging with normal direction of blood flow towards the liver. IVC: No abnormality visualized. Pancreas: Visualized portion unremarkable. Spleen: Size and appearance within normal limits. Right Kidney: Length: 11.6 cm. Echogenicity within normal limits. No mass or hydronephrosis visualized. Left Kidney: Length: 12.2 cm. Echogenicity within normal limits. No mass or hydronephrosis visualized. Abdominal aorta: No aneurysm visualized. Other findings: None. IMPRESSION: 1. No acute abnormality identified. 2. The previously noted mass in the liver is not documented on the current exam. Electronically Signed   By: Anna Barnes M.D.   On: 02/15/2024 11:21   CT HEAD WO CONTRAST Result Date: 02/11/2024 CLINICAL DATA:  blurry vision EXAM: CT HEAD WITHOUT CONTRAST TECHNIQUE: Contiguous axial images were obtained from the base of the skull through the vertex without intravenous contrast. RADIATION DOSE REDUCTION: This exam was performed according to the departmental dose-optimization program which includes automated exposure control, adjustment of the mA and/or kV according to patient size and/or use of iterative reconstruction technique. COMPARISON:  CT head 03/16/2009 FINDINGS: Brain: No evidence of large-territorial acute infarction. No parenchymal hemorrhage. No mass lesion. No extra-axial collection. No mass effect or midline shift. No hydrocephalus. Basilar cisterns are patent. Vascular: No hyperdense vessel. Skull: No acute fracture or focal lesion. Sinuses/Orbits: Paranasal sinuses and mastoid air cells are clear. The orbits are unremarkable. Other: None. IMPRESSION: No acute intracranial abnormality. Electronically Signed   By: Morgane  Naveau M.D.   On: 02/11/2024 20:03    ASSESSMENT:   PLAN:    Leukopenia: Upon review of patient's records it appears she has had intermittent leukopenia  since at least September 2018 ranging from 2.3-5.1.  Her most recent total white blood cell count is 3.0.  She has a mildly increased M spike on her SPEP which is likely clinically insignificant, but the remainder of her laboratory work including flow cytometry and [employer] myeloid panel are either negative or within normal limits.  No intervention is needed.  Patient does not require bone marrow biopsy.  Return to clinic in 6 months with repeat laboratory work and further evaluation.   Thrombocytopenia: Resolved.   UTI: Resolved. 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  She has no other evidence of endorgan damage.  Return to clinic as above in 6 months with repeat laboratory work and further evaluation. Dizziness/visual changes/headache: Unrelated to underlying blood work.  Have recommended patient go to the ED for further evaluation.   I spent a total of 30 minutes reviewing chart data, face-to-face evaluation with the patient, counseling and coordination of care as detailed above.    Patient expressed understanding and was in agreement with this plan. She also understands that She can call clinic at any time with any questions, concerns, or complaints.    Shellie Dials, MD   03/11/2024 9:45 AM

## 2024-03-11 NOTE — ED Triage Notes (Addendum)
 Pt to ED via POV from cancer center. Pt reports increased weakness, HA, intermittent blurry vision, CP. Pt seen on 4/10 for similar symptoms. Pt has been having increased falls over the last week and did hit her head. Pt also reports sensation of not fully emptying bladder and increased urinary frequency. Pt seen at cancer center usually for low platelets. Dr. Adrian Alba sent pt over for head CT. Pt denies pain currently.   Pt does not want blood drawn in triage.

## 2024-03-15 ENCOUNTER — Other Ambulatory Visit (INDEPENDENT_AMBULATORY_CARE_PROVIDER_SITE_OTHER): Payer: Self-pay | Admitting: Nurse Practitioner

## 2024-03-15 DIAGNOSIS — R600 Localized edema: Secondary | ICD-10-CM

## 2024-03-16 ENCOUNTER — Encounter (INDEPENDENT_AMBULATORY_CARE_PROVIDER_SITE_OTHER): Payer: Self-pay | Admitting: Vascular Surgery

## 2024-03-16 ENCOUNTER — Ambulatory Visit (INDEPENDENT_AMBULATORY_CARE_PROVIDER_SITE_OTHER): Admitting: Vascular Surgery

## 2024-03-16 ENCOUNTER — Ambulatory Visit (INDEPENDENT_AMBULATORY_CARE_PROVIDER_SITE_OTHER)

## 2024-03-16 VITALS — BP 129/85 | HR 71 | Resp 18 | Ht <= 58 in | Wt 159.0 lb

## 2024-03-16 DIAGNOSIS — M79605 Pain in left leg: Secondary | ICD-10-CM | POA: Diagnosis not present

## 2024-03-16 DIAGNOSIS — R600 Localized edema: Secondary | ICD-10-CM

## 2024-03-16 DIAGNOSIS — M79604 Pain in right leg: Secondary | ICD-10-CM

## 2024-03-16 DIAGNOSIS — G609 Hereditary and idiopathic neuropathy, unspecified: Secondary | ICD-10-CM | POA: Diagnosis not present

## 2024-03-17 ENCOUNTER — Encounter (INDEPENDENT_AMBULATORY_CARE_PROVIDER_SITE_OTHER): Payer: Self-pay | Admitting: Vascular Surgery

## 2024-03-17 NOTE — Progress Notes (Unsigned)
 Subjective:    Patient ID: Holly Carter, female    DOB: [birth year], 68 y.o.   MRN: [id number] Chief Complaint  Patient presents with  . Follow-up    fu pt conv + Bilat Venous Reflux    HPI  Review of Systems  Constitutional: Negative.   Cardiovascular:  Positive for leg swelling.       Leg pain greater to the left than right.  History of this pain for multiple years  Psychiatric/Behavioral: Negative.    All other systems reviewed and are negative.      Objective:    Physical Exam Vitals reviewed.  Constitutional:      Appearance: Normal appearance. She is obese.  Eyes:     Pupils: Pupils are equal, round, and reactive to light.  Cardiovascular:     Rate and Rhythm: Normal rate and regular rhythm.     Pulses: Normal pulses.     Heart sounds: Normal heart sounds.  Pulmonary:     Effort: Pulmonary effort is normal.     Breath sounds: Normal breath sounds.  Abdominal:     General: Abdomen is flat. Bowel sounds are normal.     Palpations: Abdomen is soft.  Musculoskeletal:     Right lower leg: Edema present.     Left lower leg: Edema present.  Skin:    General: Skin is warm and dry.     Capillary Refill: Capillary refill takes 2 to 3 seconds.  Neurological:     General: No focal deficit present.     Mental Status: She is alert and oriented to person, place, and time. Mental status is at baseline.  Psychiatric:        Mood and Affect: Mood normal.        Behavior: Behavior normal.        Thought Content: Thought content normal.        Judgment: Judgment normal.    BP 129/85   Pulse 71   Resp 18   Ht 4\' 9"  (1.448 m)   Wt 159 lb (72.1 kg)   BMI 34.41 kg/m   Past Medical History:  Diagnosis Date  . Arthritis   . CCC (chronic calculous cholecystitis)   . Depression   . DVT, lower extremity (HCC)    30 years ago in [place] after appendix was removed  . History of kidney stones   . Hypertension   . Pneumonia   . Pre-diabetes     Social History    Socioeconomic History  . Marital status: Married    Spouse name: Not on file  . Number of children: 6  . Years of education: Not on file  . Highest education level: Not on file  Occupational History  . Occupation: retired  Tobacco Use  . Smoking status: Never  . Smokeless tobacco: Never  Vaping Use  . Vaping status: Never Used  Substance and Sexual Activity  . Alcohol use: Not Currently  . Drug use: Never  . Sexual activity: Yes    Partners: Male  Other Topics Concern  . Not on file  Social History Narrative   Lives with daughter   Social Drivers of Health   Financial Resource Strain: Low Risk  (09/24/2023)   Received from [facility]   Overall Financial Resource Strain (CARDIA)   . Difficulty of Paying Living Expenses: Not very hard  Food Insecurity: No Food Insecurity (10/08/2023)   Hunger Vital Sign   . Worried About Radiation protection practitioner  of Food in the Last Year: Never true   . Ran Out of Food in the Last Year: Never true  Transportation Needs: No Transportation Needs (10/08/2023)   PRAPARE - Transportation   . Lack of Transportation (Medical): No   . Lack of Transportation (Non-Medical): No  Physical Activity: Not on file  Stress: Not on file  Social Connections: Not on file  Intimate Partner Violence: Not At Risk (10/08/2023)   Humiliation, Afraid, Rape, and Kick questionnaire   . Fear of Current or Ex-Partner: No   . Emotionally Abused: No   . Physically Abused: No   . Sexually Abused: No    Past Surgical History:  Procedure Laterality Date  . APPENDECTOMY     in [place]  . CHOLECYSTECTOMY  10/07/2023  . lithotripsy with stent    . TOTAL VAGINAL HYSTERECTOMY      Family History  Problem Relation Age of Onset  . Cancer Daughter        Breast cancer    No Known Allergies     Latest Ref Rng & Units 02/16/2024   12:20 PM 02/11/2024    5:01 PM 02/08/2024    8:44 AM  CBC  WBC 4.0 - 10.5 K/uL 3.0  4.4  3.7   Hemoglobin 12.0 - 15.0 g/dL 40.9   81.1  91.4   Hematocrit 36.0 - 46.0 % 40.1  41.8  41.1   Platelets 150 - 400 K/uL 170  74  131.0        CMP     Component Value Date/Time   NA 137 02/16/2024 1236   K 4.1 02/16/2024 1236   CL 105 02/16/2024 1236   CO2 23 02/16/2024 1236   GLUCOSE 125 (H) 02/16/2024 1236   BUN 16 02/16/2024 1236   CREATININE 0.46 02/16/2024 1236   CALCIUM 8.5 (L) 02/16/2024 1236   PROT 6.7 02/16/2024 1236   ALBUMIN 3.2 (L) 02/16/2024 1236   AST 35 02/16/2024 1236   ALT 50 (H) 02/16/2024 1236   ALKPHOS 79 02/16/2024 1236   BILITOT 0.5 02/16/2024 1236   GFR 96.12 02/08/2024 0844   GFRNONAA >60 02/16/2024 1236     VAS US  ABI WITH/WO TBI Result Date: 03/03/2024  LOWER EXTREMITY DOPPLER STUDY Patient Name:  Holly Carter  Date of Exam:   03/02/2024 Medical Rec #: [id number]             Accession #:    [id number] Date of Birth: 10-Aug-1956            Patient Gender: F Patient Age:   54 years Exam Location:  [facility] Procedure:      VAS US  ABI WITH/WO TBI Referring Phys: --------------------------------------------------------------------------------  Indications: Rest pain.  Performing Technologist: Faustine Hoof RVT  Examination Guidelines: A complete evaluation includes at minimum, Doppler waveform signals and systolic blood pressure reading at the level of bilateral brachial, anterior tibial, and posterior tibial arteries, when vessel segments are accessible. Bilateral testing is considered an integral part of a complete examination. Photoelectric Plethysmograph (PPG) waveforms and toe systolic pressure readings are included as required and additional duplex testing as needed. Limited examinations for reoccurring indications may be performed as noted.  ABI Findings: +---------+------------------+-----+---------+--------+ Right    Rt Pressure (mmHg)IndexWaveform Comment  +---------+------------------+-----+---------+--------+ Brachial 132                                       +---------+------------------+-----+---------+--------+  PTA      150               1.14 triphasic         +---------+------------------+-----+---------+--------+ DP       148               1.12 triphasic         +---------+------------------+-----+---------+--------+ Great Toe148               1.12 Normal            +---------+------------------+-----+---------+--------+ +---------+------------------+-----+---------+-------+ Left     Lt Pressure (mmHg)IndexWaveform Comment +---------+------------------+-----+---------+-------+ Brachial 130                                     +---------+------------------+-----+---------+-------+ PTA      154               1.17 triphasic        +---------+------------------+-----+---------+-------+ DP       149               1.13 triphasic        +---------+------------------+-----+---------+-------+ Great Toe152               1.15 Normal           +---------+------------------+-----+---------+-------+  Summary: Right: Resting right ankle-brachial index is within normal range. The right toe-brachial index is normal. Left: Resting left ankle-brachial index is within normal range. The left toe-brachial index is normal. *See table(s) above for measurements and observations.  Electronically signed by Mikki Alexander MD on 03/03/2024 at 3:53:56 PM.    Final        Assessment & Plan:   1. Bilateral leg pain (Primary) Patient presents with bilateral lower extremity swelling with pain.  Patient underwent bilateral lower extremity venous ultrasounds for reflux.  Patient is noted to have a short segment of her left thigh and her greater saphenous vein with some reflux.  Other than that both of her legs are normal.  Recommend:  I have had a long discussion with the patient regarding swelling and why it  causes symptoms.  Patient will begin wearing graduated compression on a daily basis a prescription was given. The patient will  wear the  stockings first thing in the morning and removing them in the evening. The patient is instructed specifically not to sleep in the stockings.   In addition, behavioral modification will be initiated.  This will include frequent elevation, use of over the counter pain medications and exercise such as walking.  Consideration for a lymph pump will also be made based upon the effectiveness of conservative therapy.  This would help to improve the edema control and prevent sequela such as ulcers and infections  The patient need to implement conservative measures in order to help control left lower leg pain and swelling.  We will see the patient back in 3 months for follow-up to evaluate the effectiveness of the conservative therapy.  2. Idiopathic peripheral neuropathy Continue to take medications as prescribed.  There is no change in medications for her peripheral neuropathy.  Discussed in detail and counseled on restricting sugar intake to help reduce nerve inflammation.  Patient verbalized understanding.   Current Outpatient Medications on File Prior to Visit  Medication Sig Dispense Refill  . hydrOXYzine  (ATARAX ) 25 MG tablet Take 0.5-1 tablets (12.5-25 mg total) by mouth daily as needed. 30 tablet  0  . omeprazole  (PRILOSEC) 20 MG capsule Take 1 capsule (20 mg total) by mouth daily. 90 capsule 0  . sertraline  (ZOLOFT ) 100 MG tablet Take 1 tablet (100 mg total) by mouth daily. 90 tablet 0  . amoxicillin -clavulanate (AUGMENTIN ) 875-125 MG tablet Take 1 tablet by mouth 2 (two) times daily. (Patient not taking: Reported on 03/02/2024) 20 tablet 0  . cephALEXin  (KEFLEX ) 500 MG capsule Take 1 capsule (500 mg total) by mouth 3 (three) times daily. (Patient not taking: Reported on 03/02/2024) 30 capsule 0   No current facility-administered medications on file prior to visit.    There are no Patient Instructions on file for this visit. No follow-ups on file.   Annamaria Barrette, NP

## 2024-05-03 ENCOUNTER — Other Ambulatory Visit: Payer: Self-pay

## 2024-05-03 DIAGNOSIS — R12 Heartburn: Secondary | ICD-10-CM

## 2024-05-03 MED ORDER — OMEPRAZOLE 20 MG PO CPDR: 20.0000 mg | DELAYED_RELEASE_CAPSULE | Freq: Every day | ORAL | 0 refills | Status: DC

## 2024-05-09 ENCOUNTER — Encounter: Payer: Self-pay | Admitting: Family

## 2024-06-03 ENCOUNTER — Other Ambulatory Visit: Payer: Self-pay | Admitting: Family

## 2024-06-03 DIAGNOSIS — R12 Heartburn: Secondary | ICD-10-CM

## 2024-06-06 ENCOUNTER — Other Ambulatory Visit: Payer: Self-pay | Admitting: Family

## 2024-06-06 DIAGNOSIS — R12 Heartburn: Secondary | ICD-10-CM

## 2024-06-06 MED ORDER — OMEPRAZOLE 20 MG PO CPDR: 20.0000 mg | DELAYED_RELEASE_CAPSULE | Freq: Every day | ORAL | 0 refills | Status: DC

## 2024-06-06 NOTE — Telephone Encounter (Signed)
 Copied from CRM [id number]. Topic: Clinical - Medication Refill >> Jun 06, 2024  2:38 PM Drema MATSU wrote: Medication: omeprazole  (PRILOSEC) 20 MG capsule  Has the patient contacted their pharmacy? Yes (Agent: If no, request that the patient contact the pharmacy for the refill. If patient does not wish to contact the pharmacy document the reason why and proceed with request.) (Agent: If yes, when and what did the pharmacy advise?)  This is the patient's preferred pharmacy:  [id number] [place], [place] - [address] AT [facility] OF [address] [place] [address] [place] [address] Phone: [phone] Fax: [phone]  Is this the correct pharmacy for this prescription? Yes If no, delete pharmacy and type the correct one.   Has the prescription been filled recently? Yes  Is the patient out of the medication? Yes  Has the patient been seen for an appointment in the last year OR does the patient have an upcoming appointment? Yes  Can we respond through MyChart? No  Agent: Please be advised that Rx refills may take up to 3 business days. We ask that you follow-up with your pharmacy.

## 2024-06-14 ENCOUNTER — Ambulatory Visit

## 2024-06-14 VITALS — BP 118/72 | Ht <= 58 in | Wt 161.2 lb

## 2024-06-14 DIAGNOSIS — F32A Depression, unspecified: Secondary | ICD-10-CM

## 2024-06-14 DIAGNOSIS — F419 Anxiety disorder, unspecified: Secondary | ICD-10-CM | POA: Diagnosis not present

## 2024-06-14 DIAGNOSIS — Z13820 Encounter for screening for osteoporosis: Secondary | ICD-10-CM

## 2024-06-14 DIAGNOSIS — Z0000 Encounter for general adult medical examination without abnormal findings: Secondary | ICD-10-CM

## 2024-06-14 DIAGNOSIS — Z1211 Encounter for screening for malignant neoplasm of colon: Secondary | ICD-10-CM

## 2024-06-14 DIAGNOSIS — Z1231 Encounter for screening mammogram for malignant neoplasm of breast: Secondary | ICD-10-CM

## 2024-06-14 MED ORDER — SERTRALINE HCL 100 MG PO TABS
100.0000 mg | ORAL_TABLET | Freq: Every day | ORAL | 0 refills | Status: DC
Start: 2024-06-14 — End: 2024-06-16

## 2024-06-14 NOTE — Patient Instructions (Signed)
 Ms. Holly Carter , Thank you for taking time out of your busy schedule to complete your Annual Wellness Visit with me. I enjoyed our conversation and look forward to speaking with you again next year. I, as well as your care team,  appreciate your ongoing commitment to your health goals. Please review the following plan we discussed and let me know if I can assist you in the future. Your Game plan/ To Do List    Referrals: If you haven't heard from the office you've been referred to, please reach out to them at the phone provided.   Follow up Visits: We will see or speak with you next year for your Next Medicare AWV with our clinical staff-06/15/25 @ 3:40 in person Have you seen your provider in the last 6 months (3 months if uncontrolled diabetes)? No  Clinician Recommendations:  Aim for 30 minutes of exercise or brisk walking, 6-8 glasses of water, and 5 servings of fruits and vegetables each day.       This is a list of the screenings recommended for you:  Health Maintenance  Topic Date Due   COVID-19 Vaccine (1) Never done   Hepatitis C Screening  Never done   DTaP/Tdap/Td vaccine (1 - Tdap) Never done   Zoster (Shingles) Vaccine (1 of 2) Never done   Colon Cancer Screening  Never done   Pneumococcal Vaccine for age over 74 (1 of 1 - PCV) Never done   Mammogram  Never done   DEXA scan (bone density measurement)  Never done   Flu Shot  06/03/2024   Medicare Annual Wellness Visit  06/14/2025   Hepatitis B Vaccine  Aged Out   HPV Vaccine  Aged Out   Meningitis B Vaccine  Aged Out    Advanced directives: (Declined) Advance directive discussed with you today. Even though you declined this today, please call our office should you change your mind, and we can give you the proper paperwork for you to fill out. Advance Care Planning is important because it:  [x]  Makes sure you receive the medical care that is consistent with your values, goals, and preferences  [x]  It provides  guidance to your family and loved ones and reduces their decisional burden about whether or not they are making the right decisions based on your wishes.  Follow the link provided in your after visit summary or read over the paperwork we have mailed to you to help you started getting your Advance Directives in place. If you need assistance in completing these, please reach out to us  so that we can help you!

## 2024-06-14 NOTE — Progress Notes (Signed)
 Subjective:   Holly Carter is a 68 y.o. who presents for initial Medicare Wellness preventive visit.  As a reminder, Annual Wellness Visits don't include a physical exam, and some assessments may be limited, especially if this visit is performed virtually. We may recommend an in-person follow-up visit with your provider if needed.  Visit Complete: In person  Persons Participating in Visit: Patient assisted by daughter and interpreter Nat.  AWV Questionnaire: No: Patient Medicare AWV questionnaire was not completed prior to this visit.  Cardiac Risk Factors include: advanced age (>99men, >24 women);obesity (BMI >30kg/m2)     Objective:    Today's Vitals   06/14/24 1329 06/14/24 1340  BP:  118/72  Weight: 161 lb 3.2 oz (73.1 kg)   Height: 4' 9 (1.448 m)   PainSc:  4    Body mass index is 34.88 kg/m.     06/14/2024    2:14 PM 03/11/2024   10:32 AM 03/11/2024    9:30 AM 02/16/2024   11:25 AM 02/11/2024    5:00 PM 10/08/2023   11:50 AM 10/08/2023    6:20 AM  Advanced Directives  Does Patient Have a Medical Advance Directive? No No No No No No No  Would patient like information on creating a medical advance directive?  No - Patient declined  No - Patient declined No - Patient declined No - Patient declined No - Patient declined    Current Medications (verified) Outpatient Encounter Medications as of 06/14/2024  Medication Sig   hydrOXYzine  (ATARAX ) 25 MG tablet Take 0.5-1 tablets (12.5-25 mg total) by mouth daily as needed.   omeprazole  (PRILOSEC) 20 MG capsule Take 1 capsule (20 mg total) by mouth daily.   sertraline  (ZOLOFT ) 100 MG tablet Take 1 tablet (100 mg total) by mouth daily. (Patient not taking: Reported on 06/14/2024)   No facility-administered encounter medications on file as of 06/14/2024.    Allergies (verified) Patient has no known allergies.   History: Past Medical History:  Diagnosis Date   Arthritis    CCC (chronic calculous cholecystitis)     Depression    DVT, lower extremity (HCC)    30 years ago in [place] after appendix was removed   History of kidney stones    Hypertension    Pneumonia    Pre-diabetes    Past Surgical History:  Procedure Laterality Date   APPENDECTOMY     in [place]   CHOLECYSTECTOMY  10/07/2023   lithotripsy with stent     TOTAL VAGINAL HYSTERECTOMY     Family History  Problem Relation Age of Onset   Cancer Daughter        Breast cancer   Social History   Socioeconomic History   Marital status: Married    Spouse name: Not on file   Number of children: 6   Years of education: Not on file   Highest education level: Not on file  Occupational History   Occupation: retired  Tobacco Use   Smoking status: Never   Smokeless tobacco: Never  Vaping Use   Vaping status: Never Used  Substance and Sexual Activity   Alcohol use: Not Currently   Drug use: Never   Sexual activity: Yes    Partners: Male  Other Topics Concern   Not on file  Social History Narrative   Lives with daughter   Social Drivers of Health   Financial Resource Strain: Low Risk  (06/14/2024)   Overall Financial Resource Strain (CARDIA)    Difficulty of  Paying Living Expenses: Not very hard  Food Insecurity: No Food Insecurity (06/14/2024)   Hunger Vital Sign    Worried About Running Out of Food in the Last Year: Never true    Ran Out of Food in the Last Year: Never true  Transportation Needs: No Transportation Needs (06/14/2024)   PRAPARE - Administrator, Civil Service (Medical): No    Lack of Transportation (Non-Medical): No  Physical Activity: Insufficiently Active (06/14/2024)   Exercise Vital Sign    Days of Exercise per Week: 4 days    Minutes of Exercise per Session: 20 min  Stress: Stress Concern Present (06/14/2024)   [employer] of Occupational Health - Occupational Stress Questionnaire    Feeling of Stress: Rather much  Social Connections: Socially Isolated (06/14/2024)   Social Connection  and Isolation Panel    Frequency of Communication with Friends and Family: Never    Frequency of Social Gatherings with Friends and Family: Never    Attends Religious Services: More than 4 times per year    Active Member of [employer] or Organizations: No    Attends Engineer, structural: Never    Marital Status: Separated    Tobacco Counseling Counseling given: Not Answered   Clinical Intake:  Pre-visit preparation completed: Yes  Pain : 0-10 Pain Score: 4  Pain Type: Chronic pain Pain Location: Knee (left wrist-) Pain Orientation: Left Pain Descriptors / Indicators: Aching Pain Onset: More than a month ago Pain Frequency: Intermittent Pain Relieving Factors: injections, warm compresses Effect of Pain on Daily Activities: limits movement and activities when increased pain  Pain Relieving Factors: injections, warm compresses  BMI - recorded: 34.88 Nutritional Status: BMI > 30  Obese Nutritional Risks: None Diabetes: No  Lab Results  Component Value Date   HGBA1C 5.5 01/04/2024     How often do you need to have someone help you when you read instructions, pamphlets, or other written materials from your doctor or pharmacy?: 1 - Never  Interpreter Needed?: Yes Interpreter Agency: [place] Interpreter/CAP Interpreter Name: Nat Patient Declined Interpreter : No Patient signed [place] waiver: No  Comments: lives with daughter Information entered by :: B.Elis Sauber,LPN   Activities of Daily Living     06/14/2024    2:15 PM 10/08/2023    6:20 AM  In your present state of health, do you have any difficulty performing the following activities:  Hearing? 0 0  Vision? 0 0  Difficulty concentrating or making decisions? 0 0  Walking or climbing stairs? 1   Dressing or bathing? 0   Doing errands, shopping? 1 0  Preparing Food and eating ? N   Using the Toilet? N   In the past six months, have you accidently leaked urine? Y   Do you have problems with loss  of bowel control? N   Managing your Medications? N   Managing your Finances? Y   Housekeeping or managing your Housekeeping? N     Patient Care Team: Corwin Antu, FNP as PCP - General (Family Medicine) Jacobo Evalene PARAS, MD as Consulting Physician (Oncology) [facility]  I have updated your Care Teams any recent Medical Services you may have received from other providers in the past year.     Assessment:   This is a routine wellness examination for [facility].  Hearing/Vision screen Hearing Screening - Comments:: Pt says her hearing is ok Vision Screening - Comments:: Pt says her vision is okay Dr visit  2 months ago [facility]   [place] Addressed             This Visit's Progress    Weight (lb) < 200 lb (90.7 kg)   161 lb 3.2 oz (73.1 kg)    Eating healthier, increase water       Depression Screen     06/14/2024    2:00 PM  PHQ 2/9 Scores  PHQ - 2 Score 2  PHQ- 9 Score 10    Fall Risk     06/14/2024    1:46 PM  Fall Risk   Falls in the past year? 1  Number falls in past yr: 1  Injury with Fall? 0  Risk for fall due to : Orthopedic patient  Follow up Education provided;Falls prevention discussed    MEDICARE RISK AT HOME:  Medicare Risk at Home Any stairs in or around the home?: Yes If so, are there any without handrails?: Yes Home free of loose throw rugs in walkways, pet beds, electrical cords, etc?: Yes Adequate lighting in your home to reduce risk of falls?: Yes Life alert?: No Use of a cane, walker or w/c?: Yes (does not use as needed) Grab bars in the bathroom?: No Shower chair or bench in shower?: No Elevated toilet seat or a handicapped toilet?: No  TIMED UP AND GO:  Was the test performed?  Nopt already standing but walks steady  Cognitive Function: 6CIT completed        06/14/2024    2:20 PM  6CIT Screen  What Year? 0 points  What month? 0 points  What time? 0 points  Count back from 20 4 points  Months  in reverse 0 points  Repeat phrase 10 points  Total Score 14 points    Immunizations Immunization History  Administered Date(s) Administered   Fluzone Influenza virus vaccine,trivalent (IIV3), split virus 09/24/2016    Screening Tests Health Maintenance  Topic Date Due   COVID-19 Vaccine (1) Never done   Hepatitis C Screening  Never done   DTaP/Tdap/Td (1 - Tdap) Never done   Zoster Vaccines- Shingrix (1 of 2) Never done   Colonoscopy  Never done   Pneumococcal Vaccine: 50+ Years (1 of 1 - PCV) Never done   MAMMOGRAM  Never done   DEXA SCAN  Never done   INFLUENZA VACCINE  06/03/2024   Medicare Annual Wellness (AWV)  06/14/2025   Hepatitis B Vaccines  Aged Out   HPV VACCINES  Aged Out   Meningococcal B Vaccine  Aged Out    Health Maintenance  Health Maintenance Due  Topic Date Due   COVID-19 Vaccine (1) Never done   Hepatitis C Screening  Never done   DTaP/Tdap/Td (1 - Tdap) Never done   Zoster Vaccines- Shingrix (1 of 2) Never done   Colonoscopy  Never done   Pneumococcal Vaccine: 50+ Years (1 of 1 - PCV) Never done   MAMMOGRAM  Never done   DEXA SCAN  Never done   INFLUENZA VACCINE  06/03/2024   Health Maintenance Items Addressed: Mammogram ordered, DEXA ordered, Referral sent to GI for colonoscopy  Additional Screening:  Vision Screening: Recommended annual ophthalmology exams for early detection of glaucoma and other disorders of the eye. Would you like a referral to an eye doctor? No    Dental Screening: Recommended annual dental exams for proper oral hygiene  Community Resource Referral / Chronic Care Management: CRR required this visit?  No   CCM required this  visit?  Appt scheduled with PCP for 06/16/24 and PE Jan 2026   Plan:    I have personally reviewed and noted the following in the patient's chart:   Medical and social history Use of alcohol, tobacco or illicit drugs  Current medications and supplements including opioid prescriptions.  Patient is not currently taking opioid prescriptions. Functional ability and status Nutritional status Physical activity Advanced directives List of other physicians Hospitalizations, surgeries, and ER visits in previous 12 months Vitals Screenings to include cognitive, depression, and falls Referrals and appointments  In addition, I have reviewed and discussed with patient certain preventive protocols, quality metrics, and best practice recommendations. A written personalized care plan for preventive services as well as general preventive health recommendations were provided to patient.   Erminio LITTIE Saris, LPN   1/87/7974   After Visit Summary: (In Person-Printed) AVS printed and given to the patient daughter/says pt cannot read  Notes:  PT DESPERATELY NEEDS A REFILL OF ZOLOFT  (30DAY TEED)   Comments to PCP:  Pt here for initial AWV. Pt relays not sleeping (1-3hrs per night for awhile and depression. She relays the medication/Zoloft   ran out a for a month or so. Pt daughter/pt desired appt to discuss sleep and depression. Pt denies harm to herself of plan but just does not want to live anymore. Appts made for OV 06/16/24 and PE in Jan2026.Thank you!Erminio

## 2024-06-15 ENCOUNTER — Telehealth: Payer: Self-pay

## 2024-06-15 DIAGNOSIS — I890 Lymphedema, not elsewhere classified: Secondary | ICD-10-CM | POA: Insufficient documentation

## 2024-06-15 NOTE — Progress Notes (Unsigned)
 MRN : [id number]  Holly Carter is a 68 y.o. (09/29/1956) female who presents with chief complaint of legs swell.  History of Present Illness:   The patient returns to the office for followup evaluation regarding leg swelling.  The swelling has persisted and the pain associated with swelling continues. There have not been any interval development of a ulcerations or wounds.  Since the previous visit the patient has been wearing graduated compression stockings and has noted little if any improvement in the lymphedema. The patient has been using compression routinely morning until night.  The patient also states elevation during the day and exercise is being done too.  No outpatient medications have been marked as taking for the 06/20/24 encounter (Appointment) with Jama, Cordella MATSU, MD.    Past Medical History:  Diagnosis Date   Arthritis    CCC (chronic calculous cholecystitis)    Depression    DVT, lower extremity (HCC)    30 years ago in [place] after appendix was removed   History of kidney stones    Hypertension    Pneumonia    Pre-diabetes     Past Surgical History:  Procedure Laterality Date   APPENDECTOMY     in [place]   CHOLECYSTECTOMY  10/07/2023   lithotripsy with stent     TOTAL VAGINAL HYSTERECTOMY      Social History Social History   Tobacco Use   Smoking status: Never   Smokeless tobacco: Never  Vaping Use   Vaping status: Never Used  Substance Use Topics   Alcohol use: Not Currently   Drug use: Never    Family History Family History  Problem Relation Age of Onset   Cancer Daughter        Breast cancer    No Known Allergies   REVIEW OF SYSTEMS (Negative unless checked)  Constitutional: [] Weight loss  [] Fever  [] Chills Cardiac: [] Chest pain   [] Chest pressure   [] Palpitations   [] Shortness of breath when laying flat   [] Shortness of breath with exertion. Vascular:  [] Pain in legs with walking    [x] Pain in legs with standing  [] History of DVT   [] Phlebitis   [x] Swelling in legs   [] Varicose veins   [] Non-healing ulcers Pulmonary:   [] Uses home oxygen   [] Productive cough   [] Hemoptysis   [] Wheeze  [] COPD   [] Asthma Neurologic:  [] Dizziness   [] Seizures   [] History of stroke   [] History of TIA  [] Aphasia   [] Vissual changes   [] Weakness or numbness in arm   [x] Weakness or numbness in leg Musculoskeletal:   [] Joint swelling   [] Joint pain   [] Low back pain Hematologic:  [] Easy bruising  [] Easy bleeding   [] Hypercoagulable state   [] Anemic Gastrointestinal:  [] Diarrhea   [] Vomiting  [x] Gastroesophageal reflux/heartburn   [] Difficulty swallowing. Genitourinary:  [] Chronic kidney disease   [] Difficult urination  [] Frequent urination   [] Blood in urine Skin:  [] Rashes   [] Ulcers  Psychological:  [x] History of anxiety   [x]  History of major depression.  Physical Examination  There were no vitals filed for this visit. There is no height or weight on file to calculate BMI. Gen: WD/WN, NAD Head: [place]/AT, No temporalis wasting.  Ear/Nose/Throat: Hearing grossly intact, nares w/o erythema or drainage, pinna without lesions Eyes: PER, EOMI, sclera nonicteric.  Neck: Supple, no gross  masses.  No JVD.  Pulmonary:  Good air movement, no audible wheezing, no use of accessory muscles.  Cardiac: RRR, precordium not hyperdynamic. Vascular:  scattered varicosities present bilaterally.  Mild venous stasis changes to the legs bilaterally.  3-4+ soft pitting edema, CEAP [id number]  Vessel Right Left  Radial Palpable Palpable  Gastrointestinal: soft, non-distended. No guarding/no peritoneal signs.  Musculoskeletal: M/S 5/5 throughout.  No deformity.  Neurologic: CN 2-12 intact. Pain and light touch intact in extremities.  Symmetrical.  Speech is fluent. Motor exam as listed above. Psychiatric: Judgment intact, Mood & affect appropriate for pt's clinical situation. Dermatologic: Venous rashes no ulcers  noted.  No changes consistent with cellulitis. Lymph : No lichenification or skin changes of chronic lymphedema.  CBC Lab Results  Component Value Date   WBC 3.0 (L) 02/16/2024   HGB 13.2 02/16/2024   HCT 40.1 02/16/2024   MCV 99.0 02/16/2024   PLT 170 02/16/2024    BMET    Component Value Date/Time   NA 137 02/16/2024 1236   K 4.1 02/16/2024 1236   CL 105 02/16/2024 1236   CO2 23 02/16/2024 1236   GLUCOSE 125 (H) 02/16/2024 1236   BUN 16 02/16/2024 1236   CREATININE 0.46 02/16/2024 1236   CALCIUM 8.5 (L) 02/16/2024 1236   GFRNONAA >60 02/16/2024 1236   GFRAA >60 12/08/2017 2013   CrCl cannot be calculated (Patient's most recent lab result is older than the maximum 21 days allowed.).  COAG No results found for: INR, PROTIME  Radiology No results found.   Assessment/Plan There are no diagnoses linked to this encounter.   Cordella Shawl, MD  06/15/2024 1:10 PM

## 2024-06-15 NOTE — Telephone Encounter (Signed)
 Spoke with patients granddaughter.  She stated that she will call back when her grandmother is home to schedule.  Thanks,  Elizabeth, CMA

## 2024-06-16 ENCOUNTER — Ambulatory Visit (INDEPENDENT_AMBULATORY_CARE_PROVIDER_SITE_OTHER): Admitting: Family

## 2024-06-16 ENCOUNTER — Encounter: Payer: Self-pay | Admitting: Family

## 2024-06-16 VITALS — BP 122/80 | HR 80 | Temp 98.6°F | Wt 160.0 lb

## 2024-06-16 DIAGNOSIS — G609 Hereditary and idiopathic neuropathy, unspecified: Secondary | ICD-10-CM

## 2024-06-16 DIAGNOSIS — F419 Anxiety disorder, unspecified: Secondary | ICD-10-CM | POA: Diagnosis not present

## 2024-06-16 DIAGNOSIS — F32A Depression, unspecified: Secondary | ICD-10-CM

## 2024-06-16 DIAGNOSIS — Z1211 Encounter for screening for malignant neoplasm of colon: Secondary | ICD-10-CM

## 2024-06-16 DIAGNOSIS — G479 Sleep disorder, unspecified: Secondary | ICD-10-CM | POA: Diagnosis not present

## 2024-06-16 DIAGNOSIS — R12 Heartburn: Secondary | ICD-10-CM

## 2024-06-16 MED ORDER — HYDROXYZINE HCL 25 MG PO TABS: 12.5000 mg | ORAL_TABLET | Freq: Every day | ORAL | 0 refills | Status: AC | PRN

## 2024-06-16 MED ORDER — SERTRALINE HCL 100 MG PO TABS
100.0000 mg | ORAL_TABLET | Freq: Every day | ORAL | 3 refills | Status: AC
Start: 2024-06-16 — End: ?

## 2024-06-16 MED ORDER — GABAPENTIN 100 MG PO CAPS: ORAL_CAPSULE | ORAL | 1 refills | Status: AC

## 2024-06-16 MED ORDER — OMEPRAZOLE 20 MG PO CPDR: 20.0000 mg | DELAYED_RELEASE_CAPSULE | Freq: Every day | ORAL | 1 refills | Status: AC

## 2024-06-16 NOTE — Progress Notes (Signed)
 Established Patient Office Visit  Subjective:      CC:  Chief Complaint  Patient presents with   Medical Management of Chronic Issues    Issues with depression and sleeping    HPI: Holly Carter is a 68 y.o. female presenting on 06/16/2024 for Medical Management of Chronic Issues (Issues with depression and sleeping) .  Discussed the use of AI scribe software for clinical note transcription with the patient, who gave verbal consent to proceed.  History of Present Illness Holly Carter is a 68 year old female who presents with leg pain and depression with interpreter present.   She experiences leg pain that worsens in cooler weather but is currently tolerable. The pain is located in her calves and occurs when walking. She has been under the care of a vascular specialist. Additionally, she experiences peripheral neuropathy with decreased sensation in her feet.  Regarding her depression and anxiety, she has not been taking her prescribed medication for over a week due to pharmacy issues. She has received a message that the medication is now ready for pickup. She reports difficulty sleeping, stating 'I lay down and I'm thinking, thinking, thinking, I don't get sleepy.'  She experiences heartburn, which worsens if she forgets to take her medication. The heartburn is mild but becomes more pronounced without medication.  She reports burning sensations in the bottom of her feet, describing it as feeling like they are 'on fire.' She has not been taking B12 supplements since April, after her levels were found to be high. She has an upcoming appointment for a colonoscopy, which was scheduled recently.         Social history:  Relevant past medical, surgical, family and social history reviewed and updated as indicated. Interim medical history since our last visit reviewed.  Allergies and medications reviewed and updated.  DATA REVIEWED: CHART IN EPIC     ROS:  Negative unless specifically indicated above in HPI.    Current Outpatient Medications:    gabapentin  (NEURONTIN ) 100 MG capsule, Take one po at bedtime at night time for neuropathy and then in three days increase to two tablets at night time and then increase to 3 tablets in three more days at night time for total of 300 mg nightly, Disp: 30 capsule, Rfl: 1   hydrOXYzine  (ATARAX ) 25 MG tablet, Take 0.5-1 tablets (12.5-25 mg total) by mouth daily as needed., Disp: 90 tablet, Rfl: 0   omeprazole  (PRILOSEC) 20 MG capsule, Take 1 capsule (20 mg total) by mouth daily., Disp: 90 capsule, Rfl: 1   sertraline  (ZOLOFT ) 100 MG tablet, Take 1 tablet (100 mg total) by mouth daily., Disp: 90 tablet, Rfl: 3        Objective:        BP 122/80 (BP Location: Right Arm, Patient Position: Sitting, Cuff Size: Large)   Pulse 80   Temp 98.6 F (37 C) (Temporal)   Wt 160 lb (72.6 kg)   SpO2 98%   BMI 34.62 kg/m   Physical Exam CARDIOVASCULAR: Heart sounds normal, no abnormal rhythms.  Wt Readings from Last 3 Encounters:  06/16/24 160 lb (72.6 kg)  06/14/24 161 lb 3.2 oz (73.1 kg)  03/16/24 159 lb (72.1 kg)    Physical Exam Constitutional:      General: She is not in acute distress.    Appearance: Normal appearance. She is obese. She is not ill-appearing, toxic-appearing or diaphoretic.  HENT:     Head: Normocephalic.  Cardiovascular:  Rate and Rhythm: Normal rate and regular rhythm.     Pulses:          Dorsalis pedis pulses are 2+ on the right side and 2+ on the left side.       Posterior tibial pulses are 1+ on the right side and 2+ on the left side.  Pulmonary:     Effort: Pulmonary effort is normal.     Breath sounds: Normal breath sounds.  Musculoskeletal:        General: Normal range of motion.     Right lower leg: No edema.     Left lower leg: No edema.  Neurological:     General: No focal deficit present.     Mental Status: She is alert and oriented to person, place, and  time. Mental status is at baseline.  Psychiatric:        Mood and Affect: Mood normal.        Behavior: Behavior normal.        Thought Content: Thought content normal.        Judgment: Judgment normal.     Title   Diabetic Foot Exam - detailed Is there a history of foot ulcer?: No Is there a foot ulcer now?: No Is there swelling?: No Is there elevated skin temperature?: No Is there abnormal foot shape?: No Is there a claw toe deformity?: No Are the toenails long?: No Are the toenails thick?: No Are the toenails ingrown?: No Is the skin thin, fragile, shiny and hairless?: No Normal Range of Motion?: Yes Is there foot or ankle muscle weakness?: No Do you have pain in calf while walking?: Yes Are the shoes appropriate in style and fit?: Yes Can the patient see the bottom of their feet?: Yes Pulse Foot Exam completed.: Yes   Right Posterior Tibialis: Present Left posterior Tibialis: Present   Right Dorsalis Pedis: Present Left Dorsalis Pedis: Present     Semmes-Weinstein Monofilament Test + means has sensation and - means no sensation  R Foot Test Control: Pos L Foot Test Control: Pos   R Site 1-Great Toe: Neg L Site 1-Great Toe: Neg   R Site 4: Pos L Site 4: Pos   R site 5: Pos L Site 5: Pos  R Site 6: Neg L Site 6: Neg     Image components are not supported.   Image components are not supported. Image components are not supported.  [place] Comments         Results LABS Vitamin B12: Elevated (02/2024)  RADIOLOGY Lower extremity ultrasound: Vascular changes noted (03/2024)  Assessment & Plan:   Assessment and Plan Assessment & Plan Peripheral neuropathy of the feet Chronic peripheral neuropathy of the feet with a burning sensation, described as feeling like she is on fire. No diabetes present. Vascular changes may contribute to symptoms. B12 levels were previously high, but supplementation has been stopped. - Prescribe gabapentin  100 mg:  start with one tablet at night for 3 days, then increase to two tablets at night for 3 days, and finally increase to three tablets at night. - Consider neurology referral if no improvement.  Chronic leg pain with vascular changes Chronic leg pain with vascular changes, worsens in cooler weather. Vascular doctor is monitoring the condition. Ultrasound of lower legs showed vascular changes. - Continue follow-up with vascular doctor, next appointment on August 18 at 9:15 AM.  Depression and anxiety Depression and anxiety previously managed with medication, but there was a delay in  obtaining the prescription from the pharmacy. She has been without medication for more than eight days. - Pick up medication from pharmacy. - Start with half a tablet sertraline  100 mg for the first few days before resuming normal dosage. - Send a year supply of medication to pharmacy to prevent future delays.  Insomnia Chronic insomnia with difficulty initiating sleep due to persistent thoughts. Hydroxyzine  previously prescribed for anxiety may aid sleep. Hydroxyzine  is relatively harmless and can cause drowsiness, which may be beneficial for sleep initiation. - Prescribe hydroxyzine , take one hour before sleep. - Start with half a tablet if sensitive to medication, then increase to a full tablet if needed.  Gastroesophageal reflux disease (GERD) GERD symptoms are controlled with medication, but symptoms worsen if medication is missed. - Send refill for heartburn medication. - Discuss GERD symptoms with gastroenterologist during upcoming colonoscopy appointment.  Recording duration: 23 minutes      Return in about 3 months (around 09/16/2024) for f/u neuropathy .     Ginger Patrick, MSN, APRN, FNP-C [facility]

## 2024-06-20 ENCOUNTER — Ambulatory Visit (INDEPENDENT_AMBULATORY_CARE_PROVIDER_SITE_OTHER): Admitting: Vascular Surgery

## 2024-06-20 VITALS — BP 119/78 | HR 66 | Ht <= 58 in | Wt 162.6 lb

## 2024-06-20 DIAGNOSIS — M79604 Pain in right leg: Secondary | ICD-10-CM | POA: Diagnosis not present

## 2024-06-20 DIAGNOSIS — M79605 Pain in left leg: Secondary | ICD-10-CM | POA: Diagnosis not present

## 2024-06-20 DIAGNOSIS — I890 Lymphedema, not elsewhere classified: Secondary | ICD-10-CM | POA: Diagnosis not present

## 2024-06-20 DIAGNOSIS — R12 Heartburn: Secondary | ICD-10-CM | POA: Diagnosis not present

## 2024-06-21 ENCOUNTER — Encounter (INDEPENDENT_AMBULATORY_CARE_PROVIDER_SITE_OTHER): Payer: Self-pay | Admitting: Vascular Surgery

## 2024-07-27 ENCOUNTER — Ambulatory Visit
Admission: RE | Admit: 2024-07-27 | Discharge: 2024-07-27 | Disposition: A | Discharge status: Home / Self Care | Source: Ambulatory Visit | Attending: Family | Admitting: Family

## 2024-07-27 DIAGNOSIS — I340 Nonrheumatic mitral (valve) insufficiency: Secondary | ICD-10-CM | POA: Diagnosis not present

## 2024-07-27 DIAGNOSIS — R0600 Dyspnea, unspecified: Secondary | ICD-10-CM | POA: Diagnosis not present

## 2024-07-27 DIAGNOSIS — I517 Cardiomegaly: Secondary | ICD-10-CM | POA: Diagnosis not present

## 2024-07-27 DIAGNOSIS — R0609 Other forms of dyspnea: Secondary | ICD-10-CM | POA: Insufficient documentation

## 2024-07-27 LAB — ECHOCARDIOGRAM COMPLETE
Area-P 1/2: 3.99 cm2
MV M vel: 5.09 m/s
MV Peak grad: 103.6 mmHg
S' Lateral: 2.2 cm

## 2024-07-27 NOTE — Progress Notes (Signed)
  Echocardiogram 2D Echocardiogram has been performed.  Holly Carter 07/27/2024, 12:01 PM

## 2024-07-31 ENCOUNTER — Ambulatory Visit: Payer: Self-pay | Admitting: Family

## 2024-08-03 ENCOUNTER — Encounter: Payer: Self-pay | Admitting: Family

## 2024-09-12 ENCOUNTER — Inpatient Hospital Stay: Attending: Oncology

## 2024-09-19 ENCOUNTER — Inpatient Hospital Stay: Admitting: Oncology

## 2024-09-19 ENCOUNTER — Ambulatory Visit: Admitting: Family

## 2024-10-10 ENCOUNTER — Inpatient Hospital Stay

## 2024-10-17 ENCOUNTER — Inpatient Hospital Stay: Admitting: Oncology

## 2024-11-14 ENCOUNTER — Encounter: Admitting: Family

## 2024-11-21 ENCOUNTER — Inpatient Hospital Stay: Attending: Oncology

## 2024-11-28 ENCOUNTER — Inpatient Hospital Stay: Admitting: Oncology

## 2024-12-19 ENCOUNTER — Inpatient Hospital Stay

## 2024-12-19 ENCOUNTER — Ambulatory Visit (INDEPENDENT_AMBULATORY_CARE_PROVIDER_SITE_OTHER): Admitting: Vascular Surgery

## 2024-12-23 ENCOUNTER — Inpatient Hospital Stay: Admitting: Oncology

## 2025-03-20 ENCOUNTER — Encounter: Admitting: Family

## 2025-06-15 ENCOUNTER — Ambulatory Visit

## 2025-06-19 ENCOUNTER — Ambulatory Visit
